# Patient Record
Sex: Male | Born: 1970
Health system: Southern US, Community
[De-identification: ages and names within clinical notes are randomized; demographics above are authoritative.]

## PROBLEM LIST (undated history)

## (undated) DIAGNOSIS — R000 Tachycardia, unspecified: Secondary | ICD-10-CM

## (undated) DIAGNOSIS — I10 Essential (primary) hypertension: Secondary | ICD-10-CM

## (undated) HISTORY — PX: CHOLECYSTECTOMY: SHX55

## (undated) HISTORY — PX: ANKLE SURGERY: SHX546

---

## 1998-10-11 ENCOUNTER — Ambulatory Visit (HOSPITAL_BASED_OUTPATIENT_CLINIC_OR_DEPARTMENT_OTHER): Admission: RE | Admit: 1998-10-11 | Discharge: 1998-10-11 | Payer: Self-pay | Admitting: Orthopedic Surgery

## 2004-06-30 ENCOUNTER — Encounter (INDEPENDENT_AMBULATORY_CARE_PROVIDER_SITE_OTHER): Payer: Self-pay | Admitting: Specialist

## 2004-06-30 ENCOUNTER — Ambulatory Visit (HOSPITAL_COMMUNITY): Admission: RE | Admit: 2004-06-30 | Discharge: 2004-06-30 | Payer: Self-pay | Admitting: *Deleted

## 2005-02-23 ENCOUNTER — Emergency Department: Payer: Self-pay | Admitting: Emergency Medicine

## 2008-08-27 ENCOUNTER — Ambulatory Visit: Payer: Self-pay | Admitting: Family Medicine

## 2009-06-14 ENCOUNTER — Ambulatory Visit: Payer: Self-pay

## 2009-06-26 ENCOUNTER — Encounter: Admission: RE | Admit: 2009-06-26 | Discharge: 2009-06-26 | Payer: Self-pay | Admitting: *Deleted

## 2009-07-18 ENCOUNTER — Ambulatory Visit: Payer: Self-pay | Admitting: Anesthesiology

## 2009-12-06 ENCOUNTER — Ambulatory Visit: Payer: Self-pay

## 2011-03-06 ENCOUNTER — Ambulatory Visit: Payer: Self-pay

## 2011-05-22 ENCOUNTER — Ambulatory Visit: Payer: Self-pay | Admitting: Anesthesiology

## 2011-05-27 ENCOUNTER — Ambulatory Visit: Payer: Self-pay | Admitting: Anesthesiology

## 2011-09-08 ENCOUNTER — Ambulatory Visit: Payer: Self-pay | Admitting: Family Medicine

## 2011-10-15 ENCOUNTER — Emergency Department: Payer: Self-pay | Admitting: Emergency Medicine

## 2011-10-18 ENCOUNTER — Emergency Department: Payer: Self-pay | Admitting: Emergency Medicine

## 2011-10-20 ENCOUNTER — Other Ambulatory Visit: Payer: Self-pay | Admitting: Orthopaedic Surgery

## 2011-10-20 DIAGNOSIS — M549 Dorsalgia, unspecified: Secondary | ICD-10-CM

## 2011-10-27 ENCOUNTER — Other Ambulatory Visit: Payer: Self-pay

## 2011-11-01 ENCOUNTER — Inpatient Hospital Stay: Admission: RE | Admit: 2011-11-01 | Payer: Self-pay | Source: Ambulatory Visit

## 2011-11-02 ENCOUNTER — Emergency Department: Payer: Self-pay | Admitting: Emergency Medicine

## 2011-11-03 ENCOUNTER — Ambulatory Visit
Admission: RE | Admit: 2011-11-03 | Discharge: 2011-11-03 | Disposition: A | Discharge status: Home / Self Care | Payer: BC Managed Care – PPO | Source: Ambulatory Visit | Attending: Orthopaedic Surgery | Admitting: Orthopaedic Surgery

## 2011-11-03 DIAGNOSIS — M549 Dorsalgia, unspecified: Secondary | ICD-10-CM

## 2011-11-03 MED ORDER — GADOBENATE DIMEGLUMINE 529 MG/ML IV SOLN
19.0000 mL | Freq: Once | INTRAVENOUS | Status: AC | PRN
  Administered 2011-11-03: 19 mL via INTRAVENOUS

## 2012-03-09 ENCOUNTER — Ambulatory Visit: Payer: Self-pay | Admitting: Internal Medicine

## 2012-03-09 LAB — URINALYSIS, COMPLETE
Bilirubin,UR: NEGATIVE
Glucose,UR: NEGATIVE mg/dL (ref 0–75)
Ketone: NEGATIVE

## 2012-06-16 ENCOUNTER — Emergency Department: Payer: Self-pay | Admitting: Emergency Medicine

## 2012-08-02 ENCOUNTER — Emergency Department: Payer: Self-pay | Admitting: Emergency Medicine

## 2012-08-02 LAB — CBC
HGB: 14.8 g/dL (ref 13.0–18.0)
MCHC: 33.9 g/dL (ref 32.0–36.0)
MCV: 88 fL (ref 80–100)
Platelet: 202 10*3/uL (ref 150–440)
RBC: 4.97 10*6/uL (ref 4.40–5.90)
RDW: 13.4 % (ref 11.5–14.5)
WBC: 7.5 10*3/uL (ref 3.8–10.6)

## 2012-08-02 LAB — URINALYSIS, COMPLETE
Bacteria: NONE SEEN
Bilirubin,UR: NEGATIVE
Glucose,UR: NEGATIVE mg/dL (ref 0–75)
Ketone: NEGATIVE
Nitrite: NEGATIVE
Protein: NEGATIVE
RBC,UR: 2 /HPF (ref 0–5)
Specific Gravity: 1.027 (ref 1.003–1.030)

## 2012-08-02 LAB — COMPREHENSIVE METABOLIC PANEL
Alkaline Phosphatase: 77 U/L (ref 50–136)
Anion Gap: 5 — ABNORMAL LOW (ref 7–16)
BUN: 9 mg/dL (ref 7–18)
Co2: 29 mmol/L (ref 21–32)
Creatinine: 1.12 mg/dL (ref 0.60–1.30)
EGFR (Non-African Amer.): >60
Potassium: 3.5 mmol/L (ref 3.5–5.1)
Sodium: 138 mmol/L (ref 136–145)

## 2012-08-02 LAB — LIPASE, BLOOD: Lipase: 70 U/L — ABNORMAL LOW (ref 73–393)

## 2012-09-07 ENCOUNTER — Other Ambulatory Visit: Payer: Self-pay | Admitting: Family Medicine

## 2012-09-07 DIAGNOSIS — M79671 Pain in right foot: Secondary | ICD-10-CM

## 2012-09-12 ENCOUNTER — Other Ambulatory Visit: Payer: BC Managed Care – PPO

## 2012-09-14 ENCOUNTER — Ambulatory Visit
Admission: RE | Admit: 2012-09-14 | Discharge: 2012-09-14 | Disposition: A | Discharge status: Home / Self Care | Payer: BC Managed Care – PPO | Source: Ambulatory Visit | Attending: Family Medicine | Admitting: Family Medicine

## 2012-09-14 DIAGNOSIS — M79671 Pain in right foot: Secondary | ICD-10-CM

## 2012-11-07 ENCOUNTER — Ambulatory Visit: Payer: Self-pay | Admitting: Emergency Medicine

## 2013-01-19 ENCOUNTER — Emergency Department: Payer: Self-pay | Admitting: Emergency Medicine

## 2013-01-19 LAB — COMPREHENSIVE METABOLIC PANEL
Calcium, Total: 9.2 mg/dL (ref 8.5–10.1)
Co2: 29 mmol/L (ref 21–32)
Creatinine: 1.14 mg/dL (ref 0.60–1.30)
SGOT(AST): 33 U/L (ref 15–37)
SGPT (ALT): 45 U/L (ref 12–78)
Sodium: 139 mmol/L (ref 136–145)

## 2013-01-19 LAB — CBC
HCT: 44.6 % (ref 40.0–52.0)
HGB: 15.6 g/dL (ref 13.0–18.0)
MCHC: 35 g/dL (ref 32.0–36.0)
RBC: 5.23 10*6/uL (ref 4.40–5.90)
RDW: 13.7 % (ref 11.5–14.5)

## 2013-01-30 ENCOUNTER — Other Ambulatory Visit: Payer: Self-pay | Admitting: Orthopedic Surgery

## 2013-01-30 DIAGNOSIS — M545 Low back pain, unspecified: Secondary | ICD-10-CM

## 2013-01-30 DIAGNOSIS — M5410 Radiculopathy, site unspecified: Secondary | ICD-10-CM

## 2013-01-30 DIAGNOSIS — M549 Dorsalgia, unspecified: Secondary | ICD-10-CM

## 2014-03-28 ENCOUNTER — Ambulatory Visit: Payer: Self-pay | Admitting: Physician Assistant

## 2014-04-14 ENCOUNTER — Ambulatory Visit: Payer: Self-pay | Admitting: Internal Medicine

## 2014-06-24 ENCOUNTER — Ambulatory Visit: Payer: Self-pay | Admitting: Physician Assistant

## 2014-07-20 ENCOUNTER — Emergency Department
Admit: 2014-07-20 | Disposition: A | Discharge status: Home / Self Care | Payer: Self-pay | Admitting: Emergency Medicine

## 2014-09-27 ENCOUNTER — Other Ambulatory Visit: Payer: Self-pay | Admitting: Family Medicine

## 2015-03-29 ENCOUNTER — Other Ambulatory Visit: Payer: Self-pay | Admitting: Orthopedic Surgery

## 2015-03-29 DIAGNOSIS — G8929 Other chronic pain: Secondary | ICD-10-CM

## 2015-03-29 DIAGNOSIS — M5136 Other intervertebral disc degeneration, lumbar region: Secondary | ICD-10-CM

## 2015-03-29 DIAGNOSIS — M5441 Lumbago with sciatica, right side: Principal | ICD-10-CM

## 2015-04-06 ENCOUNTER — Other Ambulatory Visit: Payer: No Typology Code available for payment source

## 2015-04-09 ENCOUNTER — Ambulatory Visit
Admission: RE | Admit: 2015-04-09 | Discharge: 2015-04-09 | Disposition: A | Discharge status: Home / Self Care | Payer: BLUE CROSS/BLUE SHIELD | Source: Ambulatory Visit | Attending: Orthopedic Surgery | Admitting: Orthopedic Surgery

## 2015-04-09 DIAGNOSIS — G8929 Other chronic pain: Secondary | ICD-10-CM

## 2015-04-09 DIAGNOSIS — M5136 Other intervertebral disc degeneration, lumbar region: Secondary | ICD-10-CM

## 2015-04-09 DIAGNOSIS — M5441 Lumbago with sciatica, right side: Principal | ICD-10-CM

## 2015-04-10 ENCOUNTER — Other Ambulatory Visit: Payer: No Typology Code available for payment source

## 2015-04-19 ENCOUNTER — Ambulatory Visit: Payer: No Typology Code available for payment source

## 2015-08-21 ENCOUNTER — Other Ambulatory Visit: Payer: Self-pay | Admitting: Unknown Physician Specialty

## 2015-08-21 DIAGNOSIS — M5416 Radiculopathy, lumbar region: Secondary | ICD-10-CM

## 2015-09-13 ENCOUNTER — Ambulatory Visit
Admission: RE | Admit: 2015-09-13 | Discharge: 2015-09-13 | Disposition: A | Discharge status: Home / Self Care | Payer: BLUE CROSS/BLUE SHIELD | Source: Ambulatory Visit | Attending: Unknown Physician Specialty | Admitting: Unknown Physician Specialty

## 2015-09-13 DIAGNOSIS — M5416 Radiculopathy, lumbar region: Secondary | ICD-10-CM

## 2015-09-13 MED ORDER — METHYLPREDNISOLONE ACETATE 40 MG/ML INJ SUSP (RADIOLOG
120.0000 mg | Freq: Once | INTRAMUSCULAR | Status: AC
  Administered 2015-09-13: 120 mg via EPIDURAL

## 2015-09-13 MED ORDER — IOPAMIDOL (ISOVUE-M 200) INJECTION 41%
1.0000 mL | Freq: Once | INTRAMUSCULAR | Status: AC
  Administered 2015-09-13: 1 mL via EPIDURAL

## 2015-09-13 NOTE — Discharge Instructions (Signed)

## 2016-02-24 ENCOUNTER — Telehealth (INDEPENDENT_AMBULATORY_CARE_PROVIDER_SITE_OTHER): Payer: Self-pay | Admitting: *Deleted

## 2016-02-24 NOTE — Telephone Encounter (Signed)
ERROR

## 2016-11-19 ENCOUNTER — Other Ambulatory Visit: Payer: Self-pay | Admitting: Podiatry

## 2016-11-19 DIAGNOSIS — G5761 Lesion of plantar nerve, right lower limb: Secondary | ICD-10-CM

## 2016-11-25 ENCOUNTER — Ambulatory Visit: Payer: BLUE CROSS/BLUE SHIELD

## 2016-12-01 ENCOUNTER — Other Ambulatory Visit: Payer: Self-pay | Admitting: Podiatry

## 2016-12-01 DIAGNOSIS — G5761 Lesion of plantar nerve, right lower limb: Secondary | ICD-10-CM

## 2016-12-05 ENCOUNTER — Ambulatory Visit
Admission: RE | Admit: 2016-12-05 | Discharge: 2016-12-05 | Disposition: A | Discharge status: Home / Self Care | Payer: Managed Care, Other (non HMO) | Source: Ambulatory Visit | Attending: Podiatry | Admitting: Podiatry

## 2016-12-05 DIAGNOSIS — M19071 Primary osteoarthritis, right ankle and foot: Secondary | ICD-10-CM | POA: Insufficient documentation

## 2016-12-05 DIAGNOSIS — M25871 Other specified joint disorders, right ankle and foot: Secondary | ICD-10-CM | POA: Diagnosis not present

## 2016-12-05 DIAGNOSIS — G5761 Lesion of plantar nerve, right lower limb: Secondary | ICD-10-CM | POA: Diagnosis not present

## 2016-12-05 LAB — POCT I-STAT CREATININE: Creatinine, Ser: 1.3 mg/dL — ABNORMAL HIGH (ref 0.61–1.24)

## 2016-12-05 MED ORDER — GADOBENATE DIMEGLUMINE 529 MG/ML IV SOLN
19.0000 mL | Freq: Once | INTRAVENOUS | Status: AC | PRN
  Administered 2016-12-05: 19 mL via INTRAVENOUS

## 2017-10-15 ENCOUNTER — Other Ambulatory Visit: Payer: Self-pay | Admitting: Anesthesiology

## 2017-10-15 ENCOUNTER — Ambulatory Visit
Admission: RE | Admit: 2017-10-15 | Discharge: 2017-10-15 | Disposition: A | Discharge status: Home / Self Care | Payer: Managed Care, Other (non HMO) | Source: Ambulatory Visit | Attending: Anesthesiology | Admitting: Anesthesiology

## 2017-10-15 DIAGNOSIS — M25511 Pain in right shoulder: Secondary | ICD-10-CM | POA: Diagnosis not present

## 2017-11-01 ENCOUNTER — Other Ambulatory Visit: Payer: Self-pay | Admitting: Orthopedic Surgery

## 2017-11-01 DIAGNOSIS — M25511 Pain in right shoulder: Secondary | ICD-10-CM

## 2017-11-19 ENCOUNTER — Other Ambulatory Visit: Payer: Managed Care, Other (non HMO)

## 2017-12-17 ENCOUNTER — Other Ambulatory Visit: Payer: Managed Care, Other (non HMO)

## 2017-12-22 ENCOUNTER — Other Ambulatory Visit: Payer: Managed Care, Other (non HMO)

## 2018-04-27 DIAGNOSIS — G8929 Other chronic pain: Secondary | ICD-10-CM | POA: Diagnosis not present

## 2018-04-27 DIAGNOSIS — M79671 Pain in right foot: Secondary | ICD-10-CM | POA: Diagnosis not present

## 2018-04-27 DIAGNOSIS — F1120 Opioid dependence, uncomplicated: Secondary | ICD-10-CM | POA: Diagnosis not present

## 2018-04-27 DIAGNOSIS — Z79899 Other long term (current) drug therapy: Secondary | ICD-10-CM | POA: Diagnosis not present

## 2018-04-27 DIAGNOSIS — M545 Low back pain: Secondary | ICD-10-CM | POA: Diagnosis not present

## 2018-05-18 DIAGNOSIS — Z1331 Encounter for screening for depression: Secondary | ICD-10-CM | POA: Diagnosis not present

## 2018-05-18 DIAGNOSIS — I10 Essential (primary) hypertension: Secondary | ICD-10-CM | POA: Diagnosis not present

## 2018-05-18 DIAGNOSIS — H538 Other visual disturbances: Secondary | ICD-10-CM | POA: Diagnosis not present

## 2018-05-18 DIAGNOSIS — R739 Hyperglycemia, unspecified: Secondary | ICD-10-CM | POA: Diagnosis not present

## 2018-05-18 DIAGNOSIS — R9431 Abnormal electrocardiogram [ECG] [EKG]: Secondary | ICD-10-CM | POA: Diagnosis not present

## 2018-06-01 DIAGNOSIS — M545 Low back pain: Secondary | ICD-10-CM | POA: Diagnosis not present

## 2018-06-01 DIAGNOSIS — F1120 Opioid dependence, uncomplicated: Secondary | ICD-10-CM | POA: Diagnosis not present

## 2018-06-01 DIAGNOSIS — M79671 Pain in right foot: Secondary | ICD-10-CM | POA: Diagnosis not present

## 2018-06-01 DIAGNOSIS — G8929 Other chronic pain: Secondary | ICD-10-CM | POA: Diagnosis not present

## 2018-06-01 DIAGNOSIS — Z79899 Other long term (current) drug therapy: Secondary | ICD-10-CM | POA: Diagnosis not present

## 2018-06-08 DIAGNOSIS — M545 Low back pain: Secondary | ICD-10-CM | POA: Diagnosis not present

## 2018-06-08 DIAGNOSIS — G894 Chronic pain syndrome: Secondary | ICD-10-CM | POA: Diagnosis not present

## 2018-06-08 DIAGNOSIS — Z9119 Patient's noncompliance with other medical treatment and regimen: Secondary | ICD-10-CM | POA: Diagnosis not present

## 2018-06-08 DIAGNOSIS — M79671 Pain in right foot: Secondary | ICD-10-CM | POA: Diagnosis not present

## 2018-07-06 DIAGNOSIS — G894 Chronic pain syndrome: Secondary | ICD-10-CM | POA: Diagnosis not present

## 2018-07-06 DIAGNOSIS — M545 Low back pain: Secondary | ICD-10-CM | POA: Diagnosis not present

## 2018-07-06 DIAGNOSIS — Z9119 Patient's noncompliance with other medical treatment and regimen: Secondary | ICD-10-CM | POA: Diagnosis not present

## 2018-07-06 DIAGNOSIS — M79671 Pain in right foot: Secondary | ICD-10-CM | POA: Diagnosis not present

## 2018-08-03 DIAGNOSIS — G894 Chronic pain syndrome: Secondary | ICD-10-CM | POA: Diagnosis not present

## 2018-08-03 DIAGNOSIS — Z9119 Patient's noncompliance with other medical treatment and regimen: Secondary | ICD-10-CM | POA: Diagnosis not present

## 2018-08-03 DIAGNOSIS — M79671 Pain in right foot: Secondary | ICD-10-CM | POA: Diagnosis not present

## 2018-08-03 DIAGNOSIS — M545 Low back pain: Secondary | ICD-10-CM | POA: Diagnosis not present
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
## 2018-08-24 DIAGNOSIS — G894 Chronic pain syndrome: Secondary | ICD-10-CM | POA: Diagnosis not present

## 2018-08-24 DIAGNOSIS — M545 Low back pain: Secondary | ICD-10-CM | POA: Diagnosis not present

## 2018-08-24 DIAGNOSIS — M79671 Pain in right foot: Secondary | ICD-10-CM | POA: Diagnosis not present

## 2018-08-24 DIAGNOSIS — Z9119 Patient's noncompliance with other medical treatment and regimen: Secondary | ICD-10-CM | POA: Diagnosis not present

## 2018-10-05 DIAGNOSIS — Z9119 Patient's noncompliance with other medical treatment and regimen: Secondary | ICD-10-CM | POA: Diagnosis not present

## 2018-10-05 DIAGNOSIS — G894 Chronic pain syndrome: Secondary | ICD-10-CM | POA: Diagnosis not present

## 2018-10-05 DIAGNOSIS — Z79899 Other long term (current) drug therapy: Secondary | ICD-10-CM | POA: Diagnosis not present

## 2018-10-05 DIAGNOSIS — M79671 Pain in right foot: Secondary | ICD-10-CM | POA: Diagnosis not present

## 2018-10-05 DIAGNOSIS — M545 Low back pain: Secondary | ICD-10-CM | POA: Diagnosis not present

## 2018-10-05 DIAGNOSIS — F1120 Opioid dependence, uncomplicated: Secondary | ICD-10-CM | POA: Diagnosis not present

## 2018-10-27 DIAGNOSIS — M79671 Pain in right foot: Secondary | ICD-10-CM | POA: Diagnosis not present

## 2018-10-27 DIAGNOSIS — M545 Low back pain: Secondary | ICD-10-CM | POA: Diagnosis not present

## 2018-10-27 DIAGNOSIS — G894 Chronic pain syndrome: Secondary | ICD-10-CM | POA: Diagnosis not present

## 2018-10-27 DIAGNOSIS — Z9119 Patient's noncompliance with other medical treatment and regimen: Secondary | ICD-10-CM | POA: Diagnosis not present

## 2018-11-02 DIAGNOSIS — M545 Low back pain: Secondary | ICD-10-CM | POA: Diagnosis not present

## 2018-11-02 DIAGNOSIS — M79671 Pain in right foot: Secondary | ICD-10-CM | POA: Diagnosis not present

## 2018-11-02 DIAGNOSIS — Z79899 Other long term (current) drug therapy: Secondary | ICD-10-CM | POA: Diagnosis not present

## 2018-11-02 DIAGNOSIS — Z9119 Patient's noncompliance with other medical treatment and regimen: Secondary | ICD-10-CM | POA: Diagnosis not present

## 2018-11-02 DIAGNOSIS — G894 Chronic pain syndrome: Secondary | ICD-10-CM | POA: Diagnosis not present

## 2018-11-02 DIAGNOSIS — F1120 Opioid dependence, uncomplicated: Secondary | ICD-10-CM | POA: Diagnosis not present

## 2018-11-07 DIAGNOSIS — J069 Acute upper respiratory infection, unspecified: Secondary | ICD-10-CM | POA: Diagnosis not present

## 2018-11-07 DIAGNOSIS — Z03818 Encounter for observation for suspected exposure to other biological agents ruled out: Secondary | ICD-10-CM | POA: Diagnosis not present

## 2018-11-07 DIAGNOSIS — R5383 Other fatigue: Secondary | ICD-10-CM | POA: Diagnosis not present

## 2018-11-07 DIAGNOSIS — Z1231 Encounter for screening mammogram for malignant neoplasm of breast: Secondary | ICD-10-CM | POA: Diagnosis not present

## 2018-11-30 DIAGNOSIS — G894 Chronic pain syndrome: Secondary | ICD-10-CM | POA: Diagnosis not present

## 2018-11-30 DIAGNOSIS — Z9119 Patient's noncompliance with other medical treatment and regimen: Secondary | ICD-10-CM | POA: Diagnosis not present

## 2018-11-30 DIAGNOSIS — M79671 Pain in right foot: Secondary | ICD-10-CM | POA: Diagnosis not present

## 2018-11-30 DIAGNOSIS — M545 Low back pain: Secondary | ICD-10-CM | POA: Diagnosis not present

## 2019-02-06 ENCOUNTER — Encounter: Payer: Self-pay | Admitting: Emergency Medicine

## 2019-02-06 ENCOUNTER — Emergency Department: Payer: BC Managed Care – PPO

## 2019-02-06 ENCOUNTER — Emergency Department
Admission: EM | Admit: 2019-02-06 | Discharge: 2019-02-06 | Disposition: A | Discharge status: Home / Self Care | Payer: BC Managed Care – PPO | Attending: Emergency Medicine | Admitting: Emergency Medicine

## 2019-02-06 ENCOUNTER — Other Ambulatory Visit: Payer: Self-pay

## 2019-02-06 DIAGNOSIS — R109 Unspecified abdominal pain: Secondary | ICD-10-CM

## 2019-02-06 DIAGNOSIS — I10 Essential (primary) hypertension: Secondary | ICD-10-CM | POA: Diagnosis not present

## 2019-02-06 DIAGNOSIS — R1011 Right upper quadrant pain: Secondary | ICD-10-CM | POA: Diagnosis present

## 2019-02-06 DIAGNOSIS — F17290 Nicotine dependence, other tobacco product, uncomplicated: Secondary | ICD-10-CM | POA: Insufficient documentation

## 2019-02-06 HISTORY — DX: Essential (primary) hypertension: I10

## 2019-02-06 LAB — COMPREHENSIVE METABOLIC PANEL
ALT: 66 U/L — ABNORMAL HIGH (ref 0–44)
AST: 40 U/L (ref 15–41)
Albumin: 4.9 g/dL (ref 3.5–5.0)
Alkaline Phosphatase: 88 U/L (ref 38–126)
Anion gap: 13 (ref 5–15)
BUN: 15 mg/dL (ref 6–20)
CO2: 28 mmol/L (ref 22–32)
Calcium: 9.7 mg/dL (ref 8.9–10.3)
Chloride: 96 mmol/L — ABNORMAL LOW (ref 98–111)
Creatinine, Ser: 1.05 mg/dL (ref 0.61–1.24)
GFR calc Af Amer: >60 mL/min (ref >60)
GFR calc non Af Amer: >60 mL/min (ref >60)
Glucose, Bld: 108 mg/dL — ABNORMAL HIGH (ref 70–99)
Potassium: 3.6 mmol/L (ref 3.5–5.1)
Sodium: 137 mmol/L (ref 135–145)
Total Bilirubin: 1.5 mg/dL — ABNORMAL HIGH (ref 0.3–1.2)
Total Protein: 8.1 g/dL (ref 6.5–8.1)

## 2019-02-06 LAB — CBC
HCT: 45.8 % (ref 39.0–52.0)
Hemoglobin: 15.6 g/dL (ref 13.0–17.0)
MCH: 28.8 pg (ref 26.0–34.0)
MCHC: 34.1 g/dL (ref 30.0–36.0)
MCV: 84.5 fL (ref 80.0–100.0)
Platelets: 248 10*3/uL (ref 150–400)
RBC: 5.42 MIL/uL (ref 4.22–5.81)
RDW: 13 % (ref 11.5–15.5)
WBC: 8.9 10*3/uL (ref 4.0–10.5)
nRBC: 0 % (ref 0.0–0.2)

## 2019-02-06 LAB — URINALYSIS, COMPLETE (UACMP) WITH MICROSCOPIC
Bacteria, UA: NONE SEEN
Bilirubin Urine: NEGATIVE
Glucose, UA: NEGATIVE mg/dL
Hgb urine dipstick: NEGATIVE
Ketones, ur: NEGATIVE mg/dL
Leukocytes,Ua: NEGATIVE
Nitrite: NEGATIVE
Protein, ur: NEGATIVE mg/dL
Specific Gravity, Urine: 1.015 (ref 1.005–1.030)
Squamous Epithelial / LPF: NONE SEEN (ref 0–5)
WBC, UA: NONE SEEN WBC/hpf (ref 0–5)
pH: 6 (ref 5.0–8.0)

## 2019-02-06 LAB — LIPASE, BLOOD: Lipase: 17 U/L (ref 11–51)

## 2019-02-06 MED ORDER — SODIUM CHLORIDE 0.9% FLUSH: 3.0000 mL | Freq: Once | INTRAVENOUS | Status: DC

## 2019-02-06 MED ORDER — FAMOTIDINE 20 MG PO TABS: 20.0000 mg | ORAL_TABLET | Freq: Every day | ORAL | 1 refills | Status: DC

## 2019-02-06 MED ORDER — DICYCLOMINE HCL 20 MG PO TABS: 20.0000 mg | ORAL_TABLET | Freq: Three times a day (TID) | ORAL | 0 refills | Status: DC | PRN

## 2019-02-06 MED ORDER — SUCRALFATE 1 G PO TABS: 1.0000 g | ORAL_TABLET | Freq: Four times a day (QID) | ORAL | 0 refills | Status: DC

## 2019-02-06 NOTE — ED Provider Notes (Signed)
[facility] Emergency Department Provider Note   ____________________________________________   I have reviewed the triage vital signs and the nursing notes.   HISTORY  Chief Complaint Abdominal Pain   History limited by: Not Limited   HPI Justin Flowers is a 48 y.o. male who presents to the emergency department today because of concerns for abdominal pain.  Patient states that the pain is located in his right abdomen.  Has been present for the past few days.  Initially the pain was intermittent.  Today however when the patient was standing up the pain became more severe.  It did start radiating into his right groin.  It was accompanied by some nausea.  Patient denies any true vomiting.  Denies any fevers.  Denies similar symptoms in the past.  Has not noticed any change in urination.  Records reviewed. Per medical record review patient has a history of HTN.  Past Medical History:  Diagnosis Date  . Hypertension     There are no active problems to display for this patient.   Past Surgical History:  Procedure Laterality Date  . ANKLE SURGERY      Prior to Admission medications   Medication Sig Start Date End Date Taking? Authorizing Provider  gabapentin (NEURONTIN) 300 MG capsule TAKE ONE CAPSULE BY MOUTH TWICE A DAY 09/27/14   Guadalupe Maple, MD    Allergies Patient has no known allergies.  History reviewed. No pertinent family history.  Social History Social History   Tobacco Use  . Smoking status: Former Smoker    Packs/day: 1.00    Years: 6.00    Pack years: 6.00    Types: Cigarettes    Quit date: 09/13/1995    Years since quitting: 23.4  . Smokeless tobacco: Current User  Substance Use Topics  . Alcohol use: Not Currently    Alcohol/week: 0.0 standard drinks  . Drug use: Never    Review of Systems Constitutional: No fever/chills Eyes: No visual changes. ENT: No sore throat. Cardiovascular: Denies chest  pain. Respiratory: Denies shortness of breath. Gastrointestinal: Positive for right sided abdominal pain. Genitourinary: Negative for dysuria. Musculoskeletal: Negative for back pain. Skin: Negative for rash. Neurological: Negative for headaches, focal weakness or numbness.  ____________________________________________   PHYSICAL EXAM:  VITAL SIGNS: ED Triage Vitals  Enc Vitals Group     BP 02/06/19 1326 (!) 158/90     Pulse Rate 02/06/19 1326 92     Resp 02/06/19 1326 20     Temp 02/06/19 1326 98 F (36.7 C)     Temp Source 02/06/19 1326 Oral     SpO2 02/06/19 1326 97 %     Weight 02/06/19 1330 215 lb (97.5 kg)     Height 02/06/19 1330 6' (1.829 m)     Head Circumference --      Peak Flow --      Pain Score 02/06/19 1327 4   Constitutional: Alert and oriented.  Eyes: Conjunctivae are normal.  ENT      Head: Normocephalic and atraumatic.      Nose: No congestion/rhinnorhea.      Mouth/Throat: Mucous membranes are moist.      Neck: No stridor. Hematological/Lymphatic/Immunilogical: No cervical lymphadenopathy. Cardiovascular: Normal rate, regular rhythm.  No murmurs, rubs, or gallops.  Respiratory: Normal respiratory effort without tachypnea nor retractions. Breath sounds are clear and equal bilaterally. No wheezes/rales/rhonchi. Gastrointestinal: Soft and tender to palpation along the right side of the abdomen.  Genitourinary: Deferred Musculoskeletal: Normal  range of motion in all extremities. No lower extremity edema. Neurologic:  Normal speech and language. No gross focal neurologic deficits are appreciated.  Skin:  Skin is warm, dry and intact. No rash noted. Psychiatric: Mood and affect are normal. Speech and behavior are normal. Patient exhibits appropriate insight and judgment.  ____________________________________________    LABS (pertinent positives/negatives)  UA clear, unremarkable CMP wnl except cl 96, glu 108, alt 66, t bili 1.5 Lipase 17 CBC wbc  8.9, hgb 15.6, plt 248 ____________________________________________   EKG  I, Phineas Semen, attending physician, personally viewed and interpreted this EKG  EKG Time: 1327 Rate: 103 Rhythm: sinus tachycardia Axis: normal Intervals: qtc 466 QRS: no st elevation, q waves III ST changes: no st elevation Impression: abnormal ekg   ____________________________________________    RADIOLOGY  CT renal No kidney stone. Normal appendix  US ruq Normal gallbladder  ____________________________________________   PROCEDURES  Procedures  ____________________________________________   INITIAL IMPRESSION / ASSESSMENT AND PLAN / ED COURSE  Pertinent labs & imaging results that were available during my care of the patient were reviewed by me and considered in my medical decision making (see chart for details).   Patient presented to the emergency department today because of concerns for intermittent right upper quadrant pain that did have some radiation to his right groin.  Was accompanied by some nausea today. On exam patient does have some tenderness to the right side of the abdomen. Given radiation to the groin did have concern for kidney stone so CT scan was ordered. This however did not show etiology of the patient's pain. [place] was then ordered to evaluate for gallstones. This was also negative. At this time unclear etiology of the patient's pain. Will give prescriptions in case of duodenitis/gastritis. Discussed return precautions and follow up with GI.  ____________________________________________   FINAL CLINICAL IMPRESSION(S) / ED DIAGNOSES  Final diagnoses:  RUQ pain  Abdominal pain, unspecified abdominal location     Note: This dictation was prepared with Dragon dictation. Any transcriptional errors that result from this process are unintentional     Phineas Semen, MD 02/06/19 1859

## 2019-02-06 NOTE — ED Triage Notes (Signed)
Right upper quadrant pain.

## 2019-02-06 NOTE — Discharge Instructions (Addendum)
Please seek medical attention for any high fevers, chest pain, shortness of breath, change in behavior, persistent vomiting, bloody stool or any other new or concerning symptoms.  

## 2019-02-06 NOTE — ED Triage Notes (Signed)
Pt presents to ED via POV with c/o RUQ abdominal pain that has radiated to RLQ and R groin. Pt states pain started several days ago and has been intermittent with today being the worst day. Pt states en route he was sweating and felt "water coming up in [his] mouth".  Upon arrival to ED Pt is alert and oriented, ambulatory without difficulty at this time. Pt states pain was "stinging and burning" en route. Pt denies any hx of abdominal surgeries.

## 2019-04-18 ENCOUNTER — Ambulatory Visit: Payer: BC Managed Care – PPO | Admitting: Gastroenterology

## 2020-01-27 ENCOUNTER — Other Ambulatory Visit: Payer: Self-pay | Admitting: Internal Medicine

## 2020-08-22 ENCOUNTER — Other Ambulatory Visit: Payer: Self-pay | Admitting: Anesthesiology

## 2020-08-22 DIAGNOSIS — M545 Low back pain, unspecified: Secondary | ICD-10-CM

## 2020-11-10 ENCOUNTER — Emergency Department
Admission: EM | Admit: 2020-11-10 | Discharge: 2020-11-11 | Disposition: A | Discharge status: Home / Self Care | Payer: 59 | Attending: Emergency Medicine | Admitting: Emergency Medicine

## 2020-11-10 ENCOUNTER — Emergency Department: Payer: 59

## 2020-11-10 ENCOUNTER — Encounter: Payer: Self-pay | Admitting: *Deleted

## 2020-11-10 ENCOUNTER — Other Ambulatory Visit: Payer: Self-pay

## 2020-11-10 DIAGNOSIS — K30 Functional dyspepsia: Secondary | ICD-10-CM

## 2020-11-10 DIAGNOSIS — D72829 Elevated white blood cell count, unspecified: Secondary | ICD-10-CM | POA: Insufficient documentation

## 2020-11-10 DIAGNOSIS — R1010 Upper abdominal pain, unspecified: Secondary | ICD-10-CM | POA: Insufficient documentation

## 2020-11-10 DIAGNOSIS — F17200 Nicotine dependence, unspecified, uncomplicated: Secondary | ICD-10-CM | POA: Insufficient documentation

## 2020-11-10 DIAGNOSIS — R1013 Epigastric pain: Secondary | ICD-10-CM | POA: Diagnosis not present

## 2020-11-10 DIAGNOSIS — Z79899 Other long term (current) drug therapy: Secondary | ICD-10-CM | POA: Insufficient documentation

## 2020-11-10 DIAGNOSIS — I10 Essential (primary) hypertension: Secondary | ICD-10-CM | POA: Insufficient documentation

## 2020-11-10 LAB — COMPREHENSIVE METABOLIC PANEL
ALT: 49 U/L — ABNORMAL HIGH (ref 0–44)
AST: 41 U/L (ref 15–41)
Albumin: 4.8 g/dL (ref 3.5–5.0)
Alkaline Phosphatase: 57 U/L (ref 38–126)
Anion gap: 10 (ref 5–15)
BUN: 18 mg/dL (ref 6–20)
CO2: 24 mmol/L (ref 22–32)
Calcium: 9.8 mg/dL (ref 8.9–10.3)
Chloride: 98 mmol/L (ref 98–111)
Creatinine, Ser: 1.15 mg/dL (ref 0.61–1.24)
GFR, Estimated: >60 mL/min (ref >60)
Glucose, Bld: 107 mg/dL — ABNORMAL HIGH (ref 70–99)
Potassium: 3.5 mmol/L (ref 3.5–5.1)
Sodium: 132 mmol/L — ABNORMAL LOW (ref 135–145)
Total Bilirubin: 2.1 mg/dL — ABNORMAL HIGH (ref 0.3–1.2)
Total Protein: 8.4 g/dL — ABNORMAL HIGH (ref 6.5–8.1)

## 2020-11-10 LAB — CBC
HCT: 47.8 % (ref 39.0–52.0)
Hemoglobin: 16.4 g/dL (ref 13.0–17.0)
MCH: 29 pg (ref 26.0–34.0)
MCHC: 34.3 g/dL (ref 30.0–36.0)
MCV: 84.5 fL (ref 80.0–100.0)
Platelets: 294 10*3/uL (ref 150–400)
RBC: 5.66 MIL/uL (ref 4.22–5.81)
RDW: 12.9 % (ref 11.5–15.5)
WBC: 12.1 10*3/uL — ABNORMAL HIGH (ref 4.0–10.5)
nRBC: 0 % (ref 0.0–0.2)

## 2020-11-10 LAB — LIPASE, BLOOD: Lipase: 27 U/L (ref 11–51)

## 2020-11-10 MED ORDER — PANTOPRAZOLE SODIUM 40 MG IV SOLR
40.0000 mg | Freq: Once | INTRAVENOUS | Status: AC
  Administered 2020-11-10: 40 mg via INTRAVENOUS
  Filled 2020-11-10: qty 40

## 2020-11-10 MED ORDER — IOHEXOL 350 MG/ML SOLN
100.0000 mL | Freq: Once | INTRAVENOUS | Status: AC | PRN
  Administered 2020-11-10: 100 mL via INTRAVENOUS

## 2020-11-10 MED ORDER — ALUM & MAG HYDROXIDE-SIMETH 200-200-20 MG/5ML PO SUSP
30.0000 mL | Freq: Once | ORAL | Status: AC
  Administered 2020-11-10: 30 mL via ORAL
  Filled 2020-11-10: qty 30

## 2020-11-10 MED ORDER — LIDOCAINE VISCOUS HCL 2 % MT SOLN
15.0000 mL | Freq: Once | OROMUCOSAL | Status: AC
  Administered 2020-11-10: 15 mL via ORAL
  Filled 2020-11-10: qty 15

## 2020-11-10 NOTE — ED Provider Notes (Signed)
[facility] Emergency Department Provider Note   ____________________________________________    I have reviewed the triage vital signs and the nursing notes.   HISTORY  Chief Complaint Abdominal Pain     HPI Justin Flowers is a 50 y.o. male with history of high blood pressure who presents with complaints of upper abdominal pain.  Patient reports pain started around noon today, he reports it has been steadily worsening.  He describes it as a burning cramping sensation.  He reports he has had this once before when he was on his way to the beach, he states that he visited an ED then and was diagnosed with possible esophagitis or esophageal spasm.  He denies diarrhea.  No vomiting.  Has not taken anything for this.  Does report that he takes buprenorphine and would prefer to avoid narcotics if possible  Past Medical History:  Diagnosis Date   Hypertension     There are no problems to display for this patient.   Past Surgical History:  Procedure Laterality Date   ANKLE SURGERY      Prior to Admission medications   Medication Sig Start Date End Date Taking? Authorizing Provider  dicyclomine (BENTYL) 20 MG tablet Take 1 tablet (20 mg total) by mouth 3 (three) times daily as needed. 02/06/19   Phineas Semen, MD  famotidine (PEPCID) 20 MG tablet Take 1 tablet (20 mg total) by mouth daily. 02/06/19 02/06/20  Phineas Semen, MD  gabapentin (NEURONTIN) 300 MG capsule TAKE ONE CAPSULE BY MOUTH TWICE A DAY 09/27/14   Steele Sizer, MD  lisinopril-hydrochlorothiazide (ZESTORETIC) 20-12.5 MG tablet TAKE 2 TABLETS BY MOUTH EVERY DAY 01/28/20   Corky Downs, MD  sucralfate (CARAFATE) 1 g tablet Take 1 tablet (1 g total) by mouth 4 (four) times daily. 02/06/19   Phineas Semen, MD     Allergies Patient has no known allergies.  No family history on file.  Social History Social History   Tobacco Use   Smoking status: Former    Packs/day:  1.00    Years: 6.00    Pack years: 6.00    Types: Cigarettes    Quit date: 09/13/1995    Years since quitting: 25.1   Smokeless tobacco: Current  Substance Use Topics   Alcohol use: Not Currently    Alcohol/week: 0.0 standard drinks   Drug use: Never    Review of Systems  Constitutional: No fever/chills Eyes: No visual changes.  ENT: No sore throat. Cardiovascular: Denies chest pain. Respiratory: Denies shortness of breath. Gastrointestinal: As above Genitourinary: Negative for dysuria. Musculoskeletal: Negative for back pain. Skin: Negative for rash. Neurological: Negative for headaches or weakness   ____________________________________________   PHYSICAL EXAM:  VITAL SIGNS: ED Triage Vitals  Enc Vitals Group     BP 11/10/20 2226 (!) 144/115     Pulse Rate 11/10/20 2226 (!) 105     Resp 11/10/20 2226 (!) 22     Temp 11/10/20 2226 98.8 F (37.1 C)     Temp src --      SpO2 11/10/20 2226 98 %     Weight --      Height --      Head Circumference --      Peak Flow --      Pain Score 11/10/20 2224 10     Pain Loc --      Pain Edu? --      Excl. in GC? --     Constitutional:  Alert and oriented.  Standing up leaning over his bed, appears uncomfortable Eyes: Conjunctivae are normal.   Nose: No congestion/rhinnorhea. Mouth/Throat: Mucous membranes are moist.    Cardiovascular: Normal rate, regular rhythm. Grossly normal heart sounds.  Good peripheral circulation. Respiratory: Normal respiratory effort.  No retractions. Lungs CTAB. Gastrointestinal: Soft, mild epigastric tenderness to palpation, no distention  Musculoskeletal: No lower extremity tenderness nor edema.  Warm and well perfused Neurologic:  Normal speech and language. No gross focal neurologic deficits are appreciated.  Skin:  Skin is warm, dry and intact. No rash noted. Psychiatric: Mood and affect are normal. Speech and behavior are normal.  ____________________________________________    LABS (all labs ordered are listed, but only abnormal results are displayed)  Labs Reviewed  CBC - Abnormal; Notable for the following components:      Result Value   WBC 12.1 (*)    All other components within normal limits  LIPASE, BLOOD  COMPREHENSIVE METABOLIC PANEL  URINALYSIS, COMPLETE (UACMP) WITH MICROSCOPIC   ____________________________________________  EKG  ED ECG REPORT I, Jene Every, the attending physician, personally viewed and interpreted this ECG.  Date: 11/10/2020  Rhythm: Sinus tachycardia QRS Axis: normal Intervals: normal ST/T Wave abnormalities: normal Narrative Interpretation: no evidence of acute ischemia  ____________________________________________  RADIOLOGY  CT abdomen pelvis ordered ____________________________________________   PROCEDURES  Procedure(s) performed: No  Procedures   Critical Care performed: No ____________________________________________   INITIAL IMPRESSION / ASSESSMENT AND PLAN / ED COURSE  Pertinent labs & imaging results that were available during my care of the patient were reviewed by me and considered in my medical decision making (see chart for details).   Patient presents with abdominal pain as described above, he appears significantly uncomfortable.  Differential includes esophagitis, gastritis, pancreatitis, colitis less likely cholelithiasis given location.  Will obtain labs, place IV.  Trial GI cocktail IV Protonix  Will obtain CT abdomen pelvis given mildly elevated white blood cell count and significant pain  I have asked my colleague to follow-up on CT results    ____________________________________________   FINAL CLINICAL IMPRESSION(S) / ED DIAGNOSES  Final diagnoses:  Epigastric pain        Note:  This document was prepared using Dragon voice recognition software and may include unintentional dictation errors.    Jene Every, MD 11/10/20 2249

## 2020-11-10 NOTE — ED Triage Notes (Signed)
Pain in the upper abdominal area radiates to the left abdomen and ribs/back, "spasms" like, started around noon, pain worse over the past hour.

## 2020-11-11 ENCOUNTER — Encounter: Payer: Self-pay | Admitting: *Deleted

## 2020-11-11 LAB — TROPONIN I (HIGH SENSITIVITY)
Troponin I (High Sensitivity): 4 ng/L (ref <18)
Troponin I (High Sensitivity): 3 ng/L (ref <18)

## 2020-11-11 MED ORDER — ALUM & MAG HYDROXIDE-SIMETH 400-400-40 MG/5ML PO SUSP: 5.0000 mL | Freq: Four times a day (QID) | ORAL | 0 refills | Status: DC | PRN

## 2020-11-11 MED ORDER — DICYCLOMINE HCL 10 MG PO CAPS: 10.0000 mg | ORAL_CAPSULE | Freq: Four times a day (QID) | ORAL | 0 refills | Status: DC

## 2020-11-11 MED ORDER — ACETAMINOPHEN 500 MG PO TABS: 1000.0000 mg | ORAL_TABLET | Freq: Once | ORAL | Status: DC

## 2020-11-11 MED ORDER — DICYCLOMINE HCL 10 MG PO CAPS
10.0000 mg | ORAL_CAPSULE | Freq: Once | ORAL | Status: AC
  Administered 2020-11-11: 10 mg via ORAL
  Filled 2020-11-11: qty 1

## 2020-11-11 NOTE — ED Notes (Signed)
Pt A&OX4 ambulatory at d/c with independent steady gait, NAD. Pt verbalized understanding of d/c instructions, prescriptions and follow up care. 

## 2020-11-11 NOTE — ED Provider Notes (Signed)
Accepted signout of this patient at 38 PM from Dr. Cyril Loosen pending blood work and imaging for epigastric abdominal pain.  CT unremarkable.  Blood work with no significant changes from previous.  Patient has been seen by GI in 2018 for similar symptoms.  Has had several previous episodes which were attributed to possible esophageal spasms.  After receiving Maalox and Bentyl patient feels markedly improved.  Will discharge home on those 2 medications.  Recommended follow-up with GI again for an endoscopy since that was not done in the past.  Discussed my standard return precautions for any worsening pain, chest pain, or fever.    I have personally reviewed the images performed during this visit and I agree with the Radiologist's read.   Interpretation by Radiologist:  CT ABDOMEN PELVIS W CONTRAST  Result Date: 11/10/2020 CLINICAL DATA:  Acute nonlocalized abdominal pain, hypertension, upper abdominal burning and cramping. EXAM: CT ABDOMEN AND PELVIS WITH CONTRAST TECHNIQUE: Multidetector CT imaging of the abdomen and pelvis was performed using the standard protocol following bolus administration of intravenous contrast. CONTRAST:  OMNIPAQUE IOHEXOL 350 MG/ML SOLN COMPARISON:  02/06/2019 FINDINGS: Lower chest: No acute abnormality. Hepatobiliary: Mild hepatic steatosis. No enhancing intrahepatic mass identified. Gallbladder unremarkable. No intra or extrahepatic biliary ductal dilation. Pancreas: Unremarkable Spleen: Unremarkable Adrenals/Urinary Tract: Adrenal glands are unremarkable. Kidneys are normal, without renal calculi, focal lesion, or hydronephrosis. Bladder is unremarkable. Stomach/Bowel: Stomach is within normal limits. Appendix appears normal. No evidence of bowel wall thickening, distention, or inflammatory changes. No free intraperitoneal gas or fluid. Vascular/Lymphatic: No significant vascular findings are present. No enlarged abdominal or pelvic lymph nodes. Reproductive: Prostate is  unremarkable. Other: No abdominal wall hernia identified.  Rectum unremarkable. Musculoskeletal: No acute or significant osseous findings. IMPRESSION: No acute intra-abdominal pathology identified. No definite radiographic explanation for the patient's reported symptoms. Mild hepatic steatosis. Electronically Signed   By: Helyn Numbers MD   On: 11/10/2020 23:38      Nita Sickle, MD 11/11/20 [phone]

## 2020-11-12 ENCOUNTER — Encounter: Payer: Self-pay | Admitting: *Deleted

## 2021-03-06 ENCOUNTER — Emergency Department
Admission: EM | Admit: 2021-03-06 | Discharge: 2021-03-06 | Disposition: A | Discharge status: Home / Self Care | Payer: 59 | Attending: Emergency Medicine | Admitting: Emergency Medicine

## 2021-03-06 ENCOUNTER — Encounter: Payer: Self-pay | Admitting: Emergency Medicine

## 2021-03-06 ENCOUNTER — Other Ambulatory Visit: Payer: Self-pay

## 2021-03-06 DIAGNOSIS — R1013 Epigastric pain: Secondary | ICD-10-CM | POA: Diagnosis present

## 2021-03-06 DIAGNOSIS — Z87891 Personal history of nicotine dependence: Secondary | ICD-10-CM | POA: Insufficient documentation

## 2021-03-06 DIAGNOSIS — I10 Essential (primary) hypertension: Secondary | ICD-10-CM | POA: Diagnosis not present

## 2021-03-06 DIAGNOSIS — K219 Gastro-esophageal reflux disease without esophagitis: Secondary | ICD-10-CM | POA: Diagnosis not present

## 2021-03-06 MED ORDER — ALUM & MAG HYDROXIDE-SIMETH 200-200-20 MG/5ML PO SUSP
30.0000 mL | Freq: Once | ORAL | Status: AC
  Administered 2021-03-06: 30 mL via ORAL
  Filled 2021-03-06: qty 30

## 2021-03-06 MED ORDER — PANTOPRAZOLE SODIUM 40 MG PO TBEC: 40.0000 mg | DELAYED_RELEASE_TABLET | Freq: Every day | ORAL | 1 refills | Status: DC

## 2021-03-06 MED ORDER — LIDOCAINE VISCOUS HCL 2 % MT SOLN
15.0000 mL | Freq: Once | OROMUCOSAL | Status: AC
  Administered 2021-03-06: 15 mL via ORAL
  Filled 2021-03-06: qty 15

## 2021-03-06 NOTE — ED Triage Notes (Signed)
Pt comes into the ED via POV c/o epigastric pain that started this morning at 3:00.  PT denies any chest pain or SHOB.  Pt took Mylanta at home.  Pt explains he takes pantoprazole normally but he ran out a week ago.  Pt explains he has come in for this before and gets a GI cocktail and feels better.  PT ambulatory and in NAD with even and unlabored respirations.

## 2021-03-06 NOTE — Discharge Instructions (Signed)
Resume Pantoprazole 40mg  daily. Return to the ER for worsening symptoms, persistent vomiting, difficulty breathing or other concerns.

## 2021-03-06 NOTE — ED Provider Notes (Signed)
Emergency Medicine Provider Triage Evaluation Note  Caton Popowski , a 50 y.o. male  was evaluated in triage.  Pt complains of epigastric pain.  This is the fourth episode.  Patient ran out of his pantoprazole.  Called his GI doctor for refill but GI doctor wants to see him first for probable endoscopy.  Review of Systems  Positive: Epigastric pain Negative: Nausea/vomiting  Physical Exam  There were no vitals taken for this visit. Gen:   Awake, mild distress   Resp:  Normal effort  MSK:   Moves extremities without difficulty  Other:  Epigastric tenderness to palpation  Medical Decision Making  Medically screening exam initiated at 6:01 AM.  Appropriate orders placed.  Chriss Czar Omura was informed that the remainder of the evaluation will be completed by another provider, this initial triage assessment does not replace that evaluation, and the importance of remaining in the ED until their evaluation is complete.  50 year old male presenting with epigastric discomfort which he relates to his GERD.  This is his fourth episode and he prefers to avoid blood work and imaging studies if possible.  He is requesting GI cocktail as he explains this has alleviated his symptoms previously.  Will trial GI cocktail and reconsider work-up if patient's symptoms do not alleviate.  Patient awaiting treatment room.   Irean Hong, MD 03/06/21 [phone]

## 2021-03-06 NOTE — ED Provider Notes (Signed)
[facility] Emergency Department Provider Note   ____________________________________________   Event Date/Time   First MD Initiated Contact with Patient 03/06/21 [phone]     (approximate)  I have reviewed the triage vital signs and the nursing notes.   HISTORY  Chief Complaint Abdominal Pain    HPI Justin Flowers is a 50 y.o. male who presents to the ED from home with a chief complaint of epigastric pain which began approximately 3 AM.  Patient has a history of GERD and recently ran out of his pantoprazole.  He called his GI doctor who wants to see him before he refills his medicines because it is likely he will need an endoscopy.  Patient states this is his fourth episode and is here just for GI cocktail.  Denies fever, cough, chest pain, shortness of breath, nausea, vomiting or dizziness.     Past Medical History:  Diagnosis Date   Hypertension     There are no problems to display for this patient.   Past Surgical History:  Procedure Laterality Date   ANKLE SURGERY      Prior to Admission medications   Medication Sig Start Date End Date Taking? Authorizing Provider  alum & mag hydroxide-simeth (MAALOX MAX) 400-400-40 MG/5ML suspension Take 5 mLs by mouth every 6 (six) hours as needed for indigestion. 11/11/20   Nita Sickle, MD  Buprenorphine HCl-Naloxone HCl 4-1 MG FILM Place 1 Film under the tongue every 12 (twelve) hours. 10/03/20   [provider]  dicyclomine (BENTYL) 10 MG capsule Take 1 capsule (10 mg total) by mouth 4 (four) times daily for 14 days. 11/11/20 11/25/20  Nita Sickle, MD  pantoprazole (PROTONIX) 40 MG tablet Take 1 tablet (40 mg total) by mouth daily. 03/06/21   Irean Hong, MD    Allergies Patient has no known allergies.  History reviewed. No pertinent family history.  Social History Social History   Tobacco Use   Smoking status: Former    Packs/day: 1.00    Years: 6.00    Pack years:  6.00    Types: Cigarettes    Quit date: 09/13/1995    Years since quitting: 25.4   Smokeless tobacco: Current  Substance Use Topics   Alcohol use: Not Currently    Alcohol/week: 0.0 standard drinks   Drug use: Never    Review of Systems  Constitutional: No fever/chills Eyes: No visual changes. ENT: No sore throat. Cardiovascular: Denies chest pain. Respiratory: Denies shortness of breath. Gastrointestinal: Positive for abdominal pain.  No nausea, no vomiting.  No diarrhea.  No constipation. Genitourinary: Negative for dysuria. Musculoskeletal: Negative for back pain. Skin: Negative for rash. Neurological: Negative for headaches, focal weakness or numbness.   ____________________________________________   PHYSICAL EXAM:  VITAL SIGNS: ED Triage Vitals  Enc Vitals Group     BP 03/06/21 0604 (!) 147/92     Pulse Rate 03/06/21 0604 79     Resp 03/06/21 0604 18     Temp 03/06/21 0604 98 F (36.7 C)     Temp Source 03/06/21 0604 Oral     SpO2 03/06/21 0604 98 %     Weight 03/06/21 0605 214 lb 15.2 oz (97.5 kg)     Height 03/06/21 0605 6' (1.829 m)     Head Circumference --      Peak Flow --      Pain Score 03/06/21 0604 4     Pain Loc --      Pain Edu? --  Excl. in GC? --     Constitutional: Alert and oriented. Well appearing and in no acute distress. Eyes: Conjunctivae are normal. PERRL. EOMI. Head: Atraumatic. Nose: No congestion/rhinnorhea. Mouth/Throat: Mucous membranes are moist.  Oropharynx non-erythematous. Neck: No stridor.   Cardiovascular: Normal rate, regular rhythm. Grossly normal heart sounds.  Good peripheral circulation. Respiratory: Normal respiratory effort.  No retractions. Lungs CTAB. Gastrointestinal: Soft and minimally tender to palpation epigastrium without rebound or guarding. No distention. No abdominal bruits. No CVA tenderness. Musculoskeletal: No lower extremity tenderness nor edema.  No joint effusions. Neurologic:  Normal speech and  language. No gross focal neurologic deficits are appreciated. No gait instability. Skin:  Skin is warm, dry and intact. No rash noted. Psychiatric: Mood and affect are normal. Speech and behavior are normal.  ____________________________________________   LABS (all labs ordered are listed, but only abnormal results are displayed)  Labs Reviewed - No data to display ____________________________________________  EKG  None ____________________________________________  RADIOLOGY I, Arielis Leonhart J, personally viewed and evaluated these images (plain radiographs) as part of my medical decision making, as well as reviewing the written report by the radiologist.  ED MD interpretation: None  Official radiology report(s): No results found.  ____________________________________________   PROCEDURES  Procedure(s) performed (including Critical Care):  Procedures   ____________________________________________   INITIAL IMPRESSION / ASSESSMENT AND PLAN / ED COURSE  As part of my medical decision making, I reviewed the following data within the electronic MEDICAL RECORD NUMBER Nursing notes reviewed and incorporated, Old chart reviewed, and Notes from prior ED visits     50 year old male presenting with epigastric discomfort, history of GERD out of pantoprazole. Differential diagnosis includes, but is not limited to, biliary disease (biliary colic, acute cholecystitis, cholangitis, choledocholithiasis, etc), intrathoracic causes for epigastric abdominal pain including ACS, gastritis, duodenitis, pancreatitis, small bowel or large bowel obstruction, abdominal aortic aneurysm, hernia, and ulcer(s).   Patient declines lab work or imaging studies.  States this is his fourth visit for the same and is requesting GI cocktail.  Will administer GI cocktail but cautioned patient he may need further work-up if his symptoms do not alleviate.  Patient felt much better approximately 20 minutes after GI  cocktail administration.  Symptoms completely resolved.  Will refill pantoprazole and patient will follow up with his GI doctor.  Strict return precautions given.  Patient verbalizes understanding agrees with plan of care.      ____________________________________________   FINAL CLINICAL IMPRESSION(S) / ED DIAGNOSES  Final diagnoses:  Epigastric pain  Gastroesophageal reflux disease, unspecified whether esophagitis present     ED Discharge Orders          Ordered    pantoprazole (PROTONIX) 40 MG tablet  Daily        03/06/21 3244             Note:  This document was prepared using Dragon voice recognition software and may include unintentional dictation errors.    Irean Hong, MD 03/07/21 [phone]

## 2021-05-15 ENCOUNTER — Other Ambulatory Visit: Payer: Self-pay

## 2021-05-15 ENCOUNTER — Encounter: Payer: Self-pay | Admitting: Emergency Medicine

## 2021-05-15 ENCOUNTER — Emergency Department
Admission: EM | Admit: 2021-05-15 | Discharge: 2021-05-15 | Disposition: A | Discharge status: Home / Self Care | Payer: 59 | Attending: Emergency Medicine | Admitting: Emergency Medicine

## 2021-05-15 DIAGNOSIS — K219 Gastro-esophageal reflux disease without esophagitis: Secondary | ICD-10-CM | POA: Diagnosis not present

## 2021-05-15 DIAGNOSIS — R1013 Epigastric pain: Secondary | ICD-10-CM

## 2021-05-15 DIAGNOSIS — I10 Essential (primary) hypertension: Secondary | ICD-10-CM | POA: Insufficient documentation

## 2021-05-15 MED ORDER — LIDOCAINE VISCOUS HCL 2 % MT SOLN
15.0000 mL | Freq: Once | OROMUCOSAL | Status: AC
  Administered 2021-05-15: 15 mL via ORAL
  Filled 2021-05-15: qty 15

## 2021-05-15 MED ORDER — ALUM & MAG HYDROXIDE-SIMETH 200-200-20 MG/5ML PO SUSP
30.0000 mL | Freq: Once | ORAL | Status: AC
  Administered 2021-05-15: 30 mL via ORAL
  Filled 2021-05-15: qty 30

## 2021-05-15 MED ORDER — SUCRALFATE 1 GM/10ML PO SUSP: 1.0000 g | Freq: Four times a day (QID) | ORAL | 1 refills | Status: DC

## 2021-05-15 NOTE — ED Triage Notes (Signed)
Pt to triage via w/c, appears uncomfortable, unable to stand uprite; st upper abd pain radiating into back x 5hrs with no accomp symptoms; st hx of same and received GI cocktail for such; took protonix and antispasmodic without relief

## 2021-05-15 NOTE — Discharge Instructions (Signed)
Add Carafate to the medicines you are already taking.  Return to the ER for worsening symptoms, persistent vomiting, difficulty breathing or other concerns.

## 2021-05-15 NOTE — ED Provider Notes (Signed)
[facility] Provider Note    Event Date/Time   First MD Initiated Contact with Patient 05/15/21 0129     (approximate)   History   Abdominal Pain   HPI  Justin Flowers is a 51 y.o. male who presents to the ED from home with a chief complaint of epigastric pain.  Patient with a history of GERD, followed by GI and is on daily Protonix.  Complains of epigastric discomfort x5 hours.  Similar symptoms previously for which patient is alleviated by GI cocktail in the ER.  Denies fever, cough, chest pain, shortness of breath, nausea or vomiting.     Past Medical History   Past Medical History:  Diagnosis Date   Hypertension      Active Problem List  There are no problems to display for this patient.    Past Surgical History   Past Surgical History:  Procedure Laterality Date   ANKLE SURGERY       Home Medications   Prior to Admission medications   Medication Sig Start Date End Date Taking? Authorizing Provider  sucralfate (CARAFATE) 1 GM/10ML suspension Take 10 mLs (1 g total) by mouth 4 (four) times daily. 05/15/21  Yes Irean Hong, MD  alum & mag hydroxide-simeth (MAALOX MAX) 400-400-40 MG/5ML suspension Take 5 mLs by mouth every 6 (six) hours as needed for indigestion. 11/11/20   Nita Sickle, MD  Buprenorphine HCl-Naloxone HCl 4-1 MG FILM Place 1 Film under the tongue every 12 (twelve) hours. 10/03/20   [provider]  dicyclomine (BENTYL) 10 MG capsule Take 1 capsule (10 mg total) by mouth 4 (four) times daily for 14 days. 11/11/20 11/25/20  Nita Sickle, MD  pantoprazole (PROTONIX) 40 MG tablet Take 1 tablet (40 mg total) by mouth daily. 03/06/21   Irean Hong, MD     Allergies  Patient has no known allergies.   Family History  No family history on file.   Physical Exam  Triage Vital Signs: ED Triage Vitals  Enc Vitals Group     BP 05/15/21 0113 (!) 136/100     Pulse Rate 05/15/21 0113 (!) 103     Resp  --      Temp --      Temp src --      SpO2 05/15/21 0113 96 %     Weight 05/15/21 0116 200 lb (90.7 kg)     Height 05/15/21 0116 6' (1.829 m)     Head Circumference --      Peak Flow --      Pain Score 05/15/21 0115 10     Pain Loc --      Pain Edu? --      Excl. in GC? --     Updated Vital Signs: BP (!) 133/92    Pulse 99    Temp 98 F (36.7 C) (Oral)    Resp 20    Ht 6' (1.829 m)    Wt 90.7 kg    SpO2 98%    BMI 27.12 kg/m    General: Awake, mild to moderate distress.  CV:  Good peripheral perfusion.  Resp:  Normal effort.  CTAB. Abd:  Epigastrium mildly tender to palpation without rebound or guarding. No distention.  No bruits.  No CVA tenderness Other:  No vesicles.   ED Results / Procedures / Treatments  Labs (all labs ordered are listed, but only abnormal results are displayed) Labs Reviewed - No data to display  EKG  ED ECG REPORT I, Dajanae Brophy J, the attending physician, personally viewed and interpreted this ECG.   Date: 05/15/2021  EKG Time: 0121  Rate: 76  Rhythm: normal sinus rhythm  Axis: Normal  Intervals:none  ST&T Change: Nonspecific    RADIOLOGY None   Official radiology report(s): No results found.   PROCEDURES:  Critical Care performed: No  Procedures   MEDICATIONS ORDERED IN ED: Medications  alum & mag hydroxide-simeth (MAALOX/MYLANTA) 200-200-20 MG/5ML suspension 30 mL (30 mLs Oral Given 05/15/21 0209)    And  lidocaine (XYLOCAINE) 2 % viscous mouth solution 15 mL (15 mLs Oral Given 05/15/21 0209)     IMPRESSION / MDM / ASSESSMENT AND PLAN / ED COURSE  I reviewed the triage vital signs and the nursing notes.                             51 year old male presenting with epigastric pain. Differential diagnosis includes, but is not limited to, biliary disease (biliary colic, acute cholecystitis, cholangitis, choledocholithiasis, etc), intrathoracic causes for epigastric abdominal pain including ACS, gastritis, duodenitis,  pancreatitis, small bowel or large bowel obstruction, abdominal aortic aneurysm, hernia, and ulcer(s).  I have personally reviewed patient's records and see that patient sees PA Scalp Level from [facility] for GI.  Patient declines lab work or imaging studies.  Reports GI cocktail usually alleviates his symptoms.  Will administer GI cocktail and reassess.   Clinical Course as of 05/15/21 0316  Thu May 15, 2021  [id number] Symptoms improved after GI cocktail.  Patient already on Protonix and Pepcid, will add Carafate.  He will follow-up with his GI doctor.  He has never had an endoscopy; I strongly encouraged him to have one.  Strict return precautions given.  Patient verbalizes understanding and agrees with plan of care. [JS]    Clinical Course User Index [JS] Irean Hong, MD     FINAL CLINICAL IMPRESSION(S) / ED DIAGNOSES   Final diagnoses:  Epigastric pain  Gastroesophageal reflux disease, unspecified whether esophagitis present     Rx / DC Orders   ED Discharge Orders          Ordered    sucralfate (CARAFATE) 1 GM/10ML suspension  4 times daily        05/15/21 0314             Note:  This document was prepared using Dragon voice recognition software and may include unintentional dictation errors.   Irean Hong, MD 05/15/21 [phone]

## 2021-05-22 ENCOUNTER — Other Ambulatory Visit: Payer: Self-pay | Admitting: Internal Medicine

## 2021-06-14 ENCOUNTER — Other Ambulatory Visit: Payer: Self-pay

## 2021-06-14 ENCOUNTER — Emergency Department
Admission: EM | Admit: 2021-06-14 | Discharge: 2021-06-14 | Disposition: A | Discharge status: Home / Self Care | Payer: 59 | Attending: Emergency Medicine | Admitting: Emergency Medicine

## 2021-06-14 DIAGNOSIS — R1010 Upper abdominal pain, unspecified: Secondary | ICD-10-CM

## 2021-06-14 DIAGNOSIS — I10 Essential (primary) hypertension: Secondary | ICD-10-CM | POA: Diagnosis not present

## 2021-06-14 DIAGNOSIS — Z79899 Other long term (current) drug therapy: Secondary | ICD-10-CM | POA: Diagnosis not present

## 2021-06-14 DIAGNOSIS — R1013 Epigastric pain: Secondary | ICD-10-CM | POA: Diagnosis present

## 2021-06-14 DIAGNOSIS — R0602 Shortness of breath: Secondary | ICD-10-CM | POA: Insufficient documentation

## 2021-06-14 DIAGNOSIS — R1011 Right upper quadrant pain: Secondary | ICD-10-CM | POA: Insufficient documentation

## 2021-06-14 MED ORDER — PANTOPRAZOLE SODIUM 40 MG PO TBEC: 40.0000 mg | DELAYED_RELEASE_TABLET | Freq: Every day | ORAL | 1 refills | Status: DC

## 2021-06-14 MED ORDER — ONDANSETRON 4 MG PO TBDP: 4.0000 mg | ORAL_TABLET | Freq: Four times a day (QID) | ORAL | 0 refills | Status: DC | PRN

## 2021-06-14 MED ORDER — LIDOCAINE VISCOUS HCL 2 % MT SOLN: 15.0000 mL | OROMUCOSAL | 1 refills | Status: DC | PRN

## 2021-06-14 MED ORDER — ALUM & MAG HYDROXIDE-SIMETH 200-200-20 MG/5ML PO SUSP
30.0000 mL | Freq: Once | ORAL | Status: AC
  Administered 2021-06-14: 30 mL via ORAL
  Filled 2021-06-14: qty 30

## 2021-06-14 MED ORDER — LIDOCAINE VISCOUS HCL 2 % MT SOLN
15.0000 mL | Freq: Once | OROMUCOSAL | Status: AC
  Administered 2021-06-14: 15 mL via ORAL
  Filled 2021-06-14: qty 15

## 2021-06-14 NOTE — ED Provider Notes (Signed)
[facility] Provider Note    Event Date/Time   First MD Initiated Contact with Patient 06/14/21 [phone]     (approximate)   History   Abdominal Pain   HPI  Justin Flowers is a 51 y.o. male with history of hypertension who presents to the emergency department with epigastric abdominal pain that is sharp, severe in nature that radiates into his back that started this evening and progressively worsened.  States he has had these episodes multiple times in the past over the past several years.  States his symptoms normally improve here with a GI cocktail.  He has taken omeprazole, Protonix and Maalox at home without relief.  He states he has had some shortness of breath and "stuttering breathing".  States he has had these episodes since having COVID-19.  He denies any chest pain or chest discomfort.  No vomiting, diarrhea, bloody stool, melena.  No previous abdominal surgery.  States he is supposed to see gastroenterology next month.  He reports he has been diagnosed with H. pylori before and took a course of antibiotics but never followed up to be rechecked.  He does take NSAIDs occasionally.  He states he avoids alcohol, spicy or acidic foods.  He is not a smoker.  He does not know what triggers these episodes but states they normally come on in the middle of the night.  He states he has an appoint with his primary care doctor this Monday.   History provided by patient.    Past Medical History:  Diagnosis Date   Hypertension     Past Surgical History:  Procedure Laterality Date   ANKLE SURGERY      MEDICATIONS:  Prior to Admission medications   Medication Sig Start Date End Date Taking? Authorizing Provider  alum & mag hydroxide-simeth (MAALOX MAX) 400-400-40 MG/5ML suspension Take 5 mLs by mouth every 6 (six) hours as needed for indigestion. 11/11/20   Nita SickleVeronese, [place], MD  Buprenorphine HCl-Naloxone HCl 4-1 MG FILM Place 1 Film under the tongue every  12 (twelve) hours. 10/03/20   [provider]  dicyclomine (BENTYL) 10 MG capsule Take 1 capsule (10 mg total) by mouth 4 (four) times daily for 14 days. 11/11/20 11/25/20  Nita SickleVeronese, [place], MD  pantoprazole (PROTONIX) 40 MG tablet Take 1 tablet (40 mg total) by mouth daily. 03/06/21   Irean HongSung, Jade J, MD  sucralfate (CARAFATE) 1 GM/10ML suspension Take 10 mLs (1 g total) by mouth 4 (four) times daily. 05/15/21   Irean HongSung, Jade J, MD    Physical Exam   Triage Vital Signs: ED Triage Vitals  Enc Vitals Group     BP 06/14/21 0449 (!) 145/62     Pulse Rate 06/14/21 0449 85     Resp --      Temp 06/14/21 0449 98 F (36.7 C)     Temp Source 06/14/21 0449 Oral     SpO2 06/14/21 0449 97 %     Weight --      Height --      Head Circumference --      Peak Flow --      Pain Score 06/14/21 0451 7     Pain Loc --      Pain Edu? --      Excl. in GC? --     Most recent vital signs: Vitals:   06/14/21 0449 06/14/21 0532  BP: (!) 145/62   Pulse: 85   Resp:  20  Temp: 98  F (36.7 C)   SpO2: 97%     CONSTITUTIONAL: Alert and oriented and responds appropriately to questions. Well-appearing; well-nourished HEAD: Normocephalic, atraumatic EYES: Conjunctivae clear, pupils appear equal, sclera nonicteric ENT: normal nose; moist mucous membranes NECK: Supple, normal ROM CARD: RRR; S1 and S2 appreciated; no murmurs, no clicks, no rubs, no gallops RESP: Normal chest excursion without splinting or tachypnea; breath sounds clear and equal bilaterally; no wheezes, no rhonchi, no rales, no hypoxia or respiratory distress, speaking full sentences ABD/GI: Normal bowel sounds; non-distended; soft, non-tender, no rebound, no guarding, no peritoneal signs BACK: The back appears normal EXT: Normal ROM in all joints; no deformity noted, no edema; no cyanosis SKIN: Normal color for age and race; warm; no rash on exposed skin NEURO: Moves all extremities equally, normal speech PSYCH: The patient's mood and  manner are appropriate.   ED Results / Procedures / Treatments   LABS: (all labs ordered are listed, but only abnormal results are displayed) Labs Reviewed - No data to display   EKG:   RADIOLOGY: My personal review and interpretation of imaging:    I have personally reviewed all radiology reports.   No results found.   PROCEDURES:  Critical Care performed: No   CRITICAL CARE Performed by: Pryor Curia   Total critical care time: 0 minutes  Critical care time was exclusive of separately billable procedures and treating other patients.  Critical care was necessary to treat or prevent imminent or life-threatening deterioration.  Critical care was time spent personally by me on the following activities: development of treatment plan with patient and/or surrogate as well as nursing, discussions with consultants, evaluation of patient's response to treatment, examination of patient, obtaining history from patient or surrogate, ordering and performing treatments and interventions, ordering and review of laboratory studies, ordering and review of radiographic studies, pulse oximetry and re-evaluation of patient's condition.   Procedures    IMPRESSION / MDM / ASSESSMENT AND PLAN / ED COURSE  I reviewed the triage vital signs and the nursing notes.    Patient here with upper abdominal pain.  History of the same.     DIFFERENTIAL DIAGNOSIS (includes but not limited to):   GERD, gastritis, esophagitis, esophageal spasm, peptic ulcer disease, H. pylori, cholecystitis, cholelithiasis, pancreatitis, cholangitis, less likely ACS, PE, dissection, hiatal hernia   PLAN: Have offered labs, chest x-ray, EKG, abdominal ultrasound multiple times to patient.  He states he has been seen for the same before.  Reviewed his records from 2020 when he had a normal right upper quadrant ultrasound other than having some hepatic steatosis but gallbladder was normal.  It also appears he was  here in July 2022 for the same and had reassuring labs and CT of his abdomen pelvis.  Patient declines work-up today and states that he would just like to see how a GI cocktail works for him.  We will give GI cocktail and reassess.   MEDICATIONS GIVEN IN ED: Medications  alum & mag hydroxide-simeth (MAALOX/MYLANTA) 200-200-20 MG/5ML suspension 30 mL (30 mLs Oral Given 06/14/21 0458)    And  lidocaine (XYLOCAINE) 2 % viscous mouth solution 15 mL (15 mLs Oral Given 06/14/21 0458)     ED COURSE: Patient states his pain has now resolved on this instantaneously after GI cocktail.  Again offered further work-up but he states he has follow-up with his PCP on Monday and has an appointment with gastroenterology in a month.  He does not want further work-up in the ED today.  I have advised him to avoid NSAIDs and encouraged him to avoid spicy, acidic, greasy foods.  We discussed avoiding caffeine and alcohol.  We will have him continue his Protonix 40 mg daily and will discharge with a prescription for viscous lidocaine.  Discussed at length return precautions.   At this time, I do not feel there is any life-threatening condition present. I reviewed all nursing notes, vitals, pertinent previous records.  All lab and urine results, EKGs, imaging ordered have been independently reviewed and interpreted by myself.  I reviewed all available radiology reports from any imaging ordered this visit.  Based on my assessment, I feel the patient is safe to be discharged home without further emergent workup and can continue workup as an outpatient as needed. Discussed all findings, treatment plan as well as usual and customary return precautions with patient.  They verbalize understanding and are comfortable with this plan.  Outpatient follow-up has been provided as needed.  All questions have been answered.    CONSULTS: No emergent consultation needed at this time given patient's pain resolved after GI cocktail and patient  refuses further emergent work-up.   OUTSIDE RECORDS REVIEWED: Reviewed patient's last gastroenterology note with Gerarda Gunther on 02/14/2019.         FINAL CLINICAL IMPRESSION(S) / ED DIAGNOSES   Final diagnoses:  Upper abdominal pain     Rx / DC Orders   ED Discharge Orders          Ordered    lidocaine (XYLOCAINE) 2 % solution  Every 4 hours PRN        06/14/21 0521    ondansetron (ZOFRAN-ODT) 4 MG disintegrating tablet  Every 6 hours PRN        06/14/21 0521    pantoprazole (PROTONIX) 40 MG tablet  Daily        06/14/21 0521             Note:  This document was prepared using Dragon voice recognition software and may include unintentional dictation errors.   Misty Rago, Delice Bison, DO 06/14/21 [phone]

## 2021-06-14 NOTE — Discharge Instructions (Signed)
I recommend close follow-up with your primary care provider as well as your gastroenterologist.  Please continue your Protonix as prescribed. ? ?If you begin having worsening pain, chest pain, shortness of breath, fever, vomiting blood or see coffee-ground appearing vomit, blood in your stool or black and tarry stools, please return to the emergency department. ? ? ?Please avoid NSAIDs such as aspirin (Goody powders), ibuprofen (Motrin, Advil), naproxen (Aleve) as these may worsen your symptoms.  Tylenol 1000 mg every 6 hours is safe to take as long as you have no history of liver problems (heavy alcohol use, cirrhosis, hepatitis).  Please avoid spicy, acidic (citrus fruits, tomato based sauces, salsa), greasy, fatty foods.  Please avoid caffeine and alcohol.  Smoking can also make GERD/acid reflux worse.  Over the counter medications such as TUMS, Maalox or Mylanta, pepcid, Prilosec or Nexium may help with your symptoms.  Do not take Prilosec or Nexium if you are already prescribed a proton pump inhibitor. ? ?

## 2021-06-14 NOTE — ED Triage Notes (Signed)
Ambulatory to triage for c/o epigastric pain. States frequent hx of same, normally treated with viscous lidocaine with relief. Denies nausea/vomiting ?

## 2022-01-17 ENCOUNTER — Emergency Department: Payer: 59

## 2022-01-17 ENCOUNTER — Other Ambulatory Visit: Payer: Self-pay

## 2022-01-17 ENCOUNTER — Emergency Department
Admission: EM | Admit: 2022-01-17 | Discharge: 2022-01-17 | Disposition: A | Discharge status: Home / Self Care | Payer: 59 | Attending: Emergency Medicine | Admitting: Emergency Medicine

## 2022-01-17 ENCOUNTER — Encounter: Payer: Self-pay | Admitting: Emergency Medicine

## 2022-01-17 DIAGNOSIS — D72829 Elevated white blood cell count, unspecified: Secondary | ICD-10-CM | POA: Insufficient documentation

## 2022-01-17 DIAGNOSIS — K219 Gastro-esophageal reflux disease without esophagitis: Secondary | ICD-10-CM | POA: Insufficient documentation

## 2022-01-17 DIAGNOSIS — I10 Essential (primary) hypertension: Secondary | ICD-10-CM | POA: Diagnosis not present

## 2022-01-17 DIAGNOSIS — R1013 Epigastric pain: Secondary | ICD-10-CM

## 2022-01-17 LAB — COMPREHENSIVE METABOLIC PANEL
ALT: 24 U/L (ref 0–44)
AST: 22 U/L (ref 15–41)
Albumin: 4.5 g/dL (ref 3.5–5.0)
Alkaline Phosphatase: 55 U/L (ref 38–126)
Anion gap: 11 (ref 5–15)
BUN: 13 mg/dL (ref 6–20)
CO2: 28 mmol/L (ref 22–32)
Calcium: 9.3 mg/dL (ref 8.9–10.3)
Chloride: 98 mmol/L (ref 98–111)
Creatinine, Ser: 1.32 mg/dL — ABNORMAL HIGH (ref 0.61–1.24)
GFR, Estimated: >60 mL/min (ref >60)
Glucose, Bld: 153 mg/dL — ABNORMAL HIGH (ref 70–99)
Potassium: 3.9 mmol/L (ref 3.5–5.1)
Sodium: 137 mmol/L (ref 135–145)
Total Bilirubin: 2.1 mg/dL — ABNORMAL HIGH (ref 0.3–1.2)
Total Protein: 7.8 g/dL (ref 6.5–8.1)

## 2022-01-17 LAB — CBC WITH DIFFERENTIAL/PLATELET
Abs Immature Granulocytes: 0.06 10*3/uL (ref 0.00–0.07)
Basophils Absolute: 0.1 10*3/uL (ref 0.0–0.1)
Basophils Relative: 1 %
Eosinophils Absolute: 0.1 10*3/uL (ref 0.0–0.5)
Eosinophils Relative: 1 %
HCT: 51.6 % (ref 39.0–52.0)
Hemoglobin: 16.8 g/dL (ref 13.0–17.0)
Immature Granulocytes: 1 %
Lymphocytes Relative: 18 %
Lymphs Abs: 2.2 10*3/uL (ref 0.7–4.0)
MCH: 27 pg (ref 26.0–34.0)
MCHC: 32.6 g/dL (ref 30.0–36.0)
MCV: 83 fL (ref 80.0–100.0)
Monocytes Absolute: 0.6 10*3/uL (ref 0.1–1.0)
Monocytes Relative: 5 %
Neutro Abs: 9 10*3/uL — ABNORMAL HIGH (ref 1.7–7.7)
Neutrophils Relative %: 74 %
Platelets: 274 10*3/uL (ref 150–400)
RBC: 6.22 MIL/uL — ABNORMAL HIGH (ref 4.22–5.81)
RDW: 13 % (ref 11.5–15.5)
WBC: 12.2 10*3/uL — ABNORMAL HIGH (ref 4.0–10.5)
nRBC: 0 % (ref 0.0–0.2)

## 2022-01-17 LAB — LIPASE, BLOOD: Lipase: 30 U/L (ref 11–51)

## 2022-01-17 LAB — TROPONIN I (HIGH SENSITIVITY): Troponin I (High Sensitivity): 3 ng/L (ref <18)

## 2022-01-17 MED ORDER — LIDOCAINE VISCOUS HCL 2 % MT SOLN
15.0000 mL | Freq: Once | OROMUCOSAL | Status: AC
  Administered 2022-01-17: 15 mL via OROMUCOSAL
  Filled 2022-01-17: qty 15

## 2022-01-17 MED ORDER — HYDROMORPHONE HCL 1 MG/ML IJ SOLN
1.0000 mg | Freq: Once | INTRAMUSCULAR | Status: AC
  Administered 2022-01-17: 1 mg via INTRAVENOUS
  Filled 2022-01-17: qty 1

## 2022-01-17 MED ORDER — SUCRALFATE 1 GM/10ML PO SUSP: 1.0000 g | Freq: Four times a day (QID) | ORAL | 1 refills | Status: DC

## 2022-01-17 MED ORDER — ONDANSETRON HCL 4 MG/2ML IJ SOLN
4.0000 mg | Freq: Once | INTRAMUSCULAR | Status: AC
  Administered 2022-01-17: 4 mg via INTRAVENOUS
  Filled 2022-01-17: qty 2

## 2022-01-17 MED ORDER — OXYCODONE-ACETAMINOPHEN 5-325 MG PO TABS: 1.0000 | ORAL_TABLET | ORAL | 0 refills | Status: DC | PRN

## 2022-01-17 MED ORDER — OXYCODONE-ACETAMINOPHEN 5-325 MG PO TABS
2.0000 | ORAL_TABLET | Freq: Once | ORAL | Status: AC
  Administered 2022-01-17: 2 via ORAL
  Filled 2022-01-17: qty 2

## 2022-01-17 MED ORDER — ONDANSETRON 4 MG PO TBDP: 4.0000 mg | ORAL_TABLET | Freq: Three times a day (TID) | ORAL | 0 refills | Status: DC | PRN

## 2022-01-17 MED ORDER — ALUM & MAG HYDROXIDE-SIMETH 200-200-20 MG/5ML PO SUSP
30.0000 mL | Freq: Once | ORAL | Status: AC
  Administered 2022-01-17: 30 mL via ORAL
  Filled 2022-01-17: qty 30

## 2022-01-17 MED ORDER — FAMOTIDINE IN NACL 20-0.9 MG/50ML-% IV SOLN
20.0000 mg | Freq: Once | INTRAVENOUS | Status: AC
  Administered 2022-01-17: 20 mg via INTRAVENOUS
  Filled 2022-01-17: qty 50

## 2022-01-17 MED ORDER — HYDROMORPHONE HCL 1 MG/ML IJ SOLN
0.5000 mg | Freq: Once | INTRAMUSCULAR | Status: AC
  Administered 2022-01-17: 0.5 mg via INTRAVENOUS
  Filled 2022-01-17: qty 0.5

## 2022-01-17 MED ORDER — FAMOTIDINE 20 MG PO TABS: 20.0000 mg | ORAL_TABLET | Freq: Two times a day (BID) | ORAL | 0 refills | Status: DC

## 2022-01-17 NOTE — ED Provider Notes (Signed)
[facility] Provider Note    Event Date/Time   First MD Initiated Contact with Patient 01/17/22 [phone]     (approximate)   History   Abdominal Pain   HPI  Justin Flowers is a 51 y.o. male who presents to the ED from home with a chief complaint of epigastric pain.  Patient with a history of GERD with several ED visits for which he declines lab work and improved after GI cocktail.  Reports epigastric pain around 10 PM after eating [place] food.  Denies fever, chills, chest pain, shortness of breath, nausea/vomiting or dizziness.  States he had an endoscopy since he last came to the emergency department and is currently on Protonix.     Past Medical History   Past Medical History:  Diagnosis Date   Hypertension   GERD   Active Problem List  There are no problems to display for this patient.    Past Surgical History   Past Surgical History:  Procedure Laterality Date   ANKLE SURGERY       Home Medications   Prior to Admission medications   Medication Sig Start Date End Date Taking? Authorizing Provider  famotidine (PEPCID) 20 MG tablet Take 1 tablet (20 mg total) by mouth 2 (two) times daily. 01/17/22  Yes Irean Hong, MD  ondansetron (ZOFRAN-ODT) 4 MG disintegrating tablet Take 1 tablet (4 mg total) by mouth every 8 (eight) hours as needed for nausea or vomiting. 01/17/22  Yes Irean Hong, MD  oxyCODONE-acetaminophen (PERCOCET/ROXICET) 5-325 MG tablet Take 1 tablet by mouth every 4 (four) hours as needed for severe pain. 01/17/22  Yes Irean Hong, MD  sucralfate (CARAFATE) 1 GM/10ML suspension Take 10 mLs (1 g total) by mouth 4 (four) times daily. 01/17/22  Yes Irean Hong, MD  alum & mag hydroxide-simeth (MAALOX MAX) 400-400-40 MG/5ML suspension Take 5 mLs by mouth every 6 (six) hours as needed for indigestion. 11/11/20   Nita Sickle, MD  Buprenorphine HCl-Naloxone HCl 4-1 MG FILM Place 1 Film under the tongue every 12 (twelve)  hours. 10/03/20   [provider]  lidocaine (XYLOCAINE) 2 % solution Use as directed 15 mLs in the mouth or throat every 4 (four) hours as needed (For upper abdominal pain). 06/14/21   Ward, Layla Maw, DO  pantoprazole (PROTONIX) 40 MG tablet Take 1 tablet (40 mg total) by mouth daily. 03/06/21   Irean Hong, MD  pantoprazole (PROTONIX) 40 MG tablet Take 1 tablet (40 mg total) by mouth daily. 06/14/21 06/14/22  Ward, Layla Maw, DO     Allergies  Patient has no known allergies.   Family History  No family history on file.   Physical Exam  Triage Vital Signs: ED Triage Vitals  Enc Vitals Group     BP 01/17/22 0256 (!) 126/101     Pulse Rate 01/17/22 0256 (!) 59     Resp --      Temp 01/17/22 0256 98.1 F (36.7 C)     Temp Source 01/17/22 0256 Oral     SpO2 01/17/22 0256 95 %     Weight 01/17/22 0257 200 lb (90.7 kg)     Height 01/17/22 0257 6' (1.829 m)     Head Circumference --      Peak Flow --      Pain Score 01/17/22 0256 10     Pain Loc --      Pain Edu? --  Excl. in GC? --     Updated Vital Signs: BP (!) 131/98   Pulse 79   Temp 98.1 F (36.7 C) (Oral)   Resp 15   Ht 6' (1.829 m)   Wt 90.7 kg   SpO2 98%   BMI 27.12 kg/m    General: Awake, moderate distress.  Appears uncomfortable. CV:  RRR.  Good peripheral perfusion.  Resp:  Normal effort.  CTA B. Abd:  Moderate epigastric tenderness to palpation without rebound or guarding.  No distention.  Other:  No truncal vesicles.   ED Results / Procedures / Treatments  Labs (all labs ordered are listed, but only abnormal results are displayed) Labs Reviewed  CBC WITH DIFFERENTIAL/PLATELET - Abnormal; Notable for the following components:      Result Value   WBC 12.2 (*)    RBC 6.22 (*)    Neutro Abs 9.0 (*)    All other components within normal limits  COMPREHENSIVE METABOLIC PANEL - Abnormal; Notable for the following components:   Glucose, Bld 153 (*)    Creatinine, Ser 1.32 (*)    Total  Bilirubin 2.1 (*)    All other components within normal limits  LIPASE, BLOOD  TROPONIN I (HIGH SENSITIVITY)     EKG  ED ECG REPORT I, Danetra Glock J, the attending physician, personally viewed and interpreted this ECG.   Date: 01/17/2022  EKG Time: 0249  Rate: 89  Rhythm: normal sinus rhythm  Axis: Normal  Intervals:none  ST&T Change: Nonspecific    RADIOLOGY I have independently visualized and interpreted patient's x-ray and ultrasound as well as noted the radiology interpretation:  X-ray: Shallow lung volumes, otherwise no acute cardiopulmonary process  Ultrasound: Gallbladder sludge without evidence of cholecystitis  Official radiology report(s): US ABDOMEN LIMITED RUQ (LIVER/GB)  Result Date: 01/17/2022 CLINICAL DATA:  51 year old male with postprandial upper abdominal pain. EXAM: ULTRASOUND ABDOMEN LIMITED RIGHT UPPER QUADRANT COMPARISON:  CT Abdomen and Pelvis 11/10/2020. Abdomen ultrasound 02/06/2019. FINDINGS: Gallbladder: Layering sludge within the gallbladder. Gallbladder wall thickness remains within normal limits. No shadowing echogenic stones identified. No pericholecystic fluid. No sonographic Murphy sign elicited. Common bile duct: Diameter: 2-3 mm, normal. Liver: Chronically echogenic liver (image 27) and some hepatic steatosis demonstrated by CT last year. No discrete liver lesion. No intrahepatic biliary ductal dilatation. Portal vein is patent on color Doppler imaging with normal direction of blood flow towards the liver. Other: Negative visible right kidney.  No free fluid. IMPRESSION: 1. Gallbladder sludge but no evidence of stones or acute cholecystitis. 2. Chronic fatty liver disease. No evidence of bile duct obstruction. Electronically Signed   By: Odessa Fleming M.D.   On: 01/17/2022 05:26   DG Chest Port 1 View  Result Date: 01/17/2022 CLINICAL DATA:  51 year old male with history of epigastric pain. EXAM: PORTABLE CHEST 1 VIEW COMPARISON:  No priors. FINDINGS:  Lung volumes are low. No consolidative airspace disease. No pleural effusions. No pneumothorax. No pulmonary nodule or mass noted. Pulmonary vasculature and the cardiomediastinal silhouette are within normal limits. IMPRESSION: Low lung volumes without radiographic evidence of acute cardiopulmonary disease. Electronically Signed   By: Trudie Reed M.D.   On: 01/17/2022 05:15     PROCEDURES:  Critical Care performed: No  .1-3 Lead EKG Interpretation  Performed by: Irean Hong, MD Authorized by: Irean Hong, MD     Interpretation: normal     ECG rate:  60   ECG rate assessment: normal     Rhythm: sinus rhythm  Ectopy: none     Conduction: normal   Comments:     Patient placed on cardiac monitor to evaluate for arrhythmias    MEDICATIONS ORDERED IN ED: Medications  alum & mag hydroxide-simeth (MAALOX/MYLANTA) 200-200-20 MG/5ML suspension 30 mL (30 mLs Oral Given 01/17/22 0302)  lidocaine (XYLOCAINE) 2 % viscous mouth solution 15 mL (15 mLs Mouth/Throat Given 01/17/22 0302)  famotidine (PEPCID) IVPB 20 mg premix (0 mg Intravenous Stopped 01/17/22 0508)  HYDROmorphone (DILAUDID) injection 0.5 mg (0.5 mg Intravenous Given 01/17/22 0437)  ondansetron (ZOFRAN) injection 4 mg (4 mg Intravenous Given 01/17/22 0437)  HYDROmorphone (DILAUDID) injection 1 mg (1 mg Intravenous Given 01/17/22 0506)  ondansetron (ZOFRAN) injection 4 mg (4 mg Intravenous Given 01/17/22 0506)  oxyCODONE-acetaminophen (PERCOCET/ROXICET) 5-325 MG per tablet 2 tablet (2 tablets Oral Given 01/17/22 0606)     IMPRESSION / MDM / ASSESSMENT AND PLAN / ED COURSE  I reviewed the triage vital signs and the nursing notes.                             51 year old male presenting with epigastric pain; history of GERD. Differential diagnosis includes, but is not limited to, biliary disease (biliary colic, acute cholecystitis, cholangitis, choledocholithiasis, etc), intrathoracic causes for epigastric abdominal pain including  ACS, gastritis, duodenitis, pancreatitis, small bowel or large bowel obstruction, abdominal aortic aneurysm, hernia, and ulcer(s).  I have personally reviewed patient's records and note his GI office visit from 07/02/2021.  I see that he was scheduled for EGD/colonoscopy but am unable to see the results.  Patient reports he did have these procedures done and was told they were normal.  Patient's presentation is most consistent with acute presentation with potential threat to life or bodily function.  The patient is on the cardiac monitor to evaluate for evidence of arrhythmia and/or significant heart rate changes.  Patient continues to complain of severe epigastric pain despite GI cocktail administered at triage.  Acquiesced to lab work and IV.  Will obtain right upper quadrant abdominal ultrasound.  Administer IV Pepcid and Dilaudid for pain.  Will reassess.  Clinical Course as of 01/17/22 5188  Sat Jan 17, 2022  4166 Transient relief of symptoms after IV Dilaudid but pain now returning.  Will redose. [JS]  0510 Pain down to 3/10 after IV Dilaudid.  Laboratory results demonstrate mild leukocytosis WBC 12.2, mild AKI, T. bili stable from prior.  Awaiting ultrasound results. [JS]  0608 Updated patient on ultrasound results demonstrating gallbladder sludge.  Will refer to general surgery for further work-up with possible HIDA scan.  We will add Carafate and Pepcid to patient's GERD regimen of Protonix.  Will prescribe as needed Percocet and Zofran to use as needed for more severe pain and nausea.  Strict return precautions given.  Patient verbalizes understanding and agrees with plan of care. [JS]    Clinical Course User Index [JS] Paulette Blanch, MD     FINAL CLINICAL IMPRESSION(S) / ED DIAGNOSES   Final diagnoses:  Epigastric pain  Gastroesophageal reflux disease, unspecified whether esophagitis present     Rx / DC Orders   ED Discharge Orders          Ordered    sucralfate (CARAFATE) 1  GM/10ML suspension  4 times daily        01/17/22 0604    famotidine (PEPCID) 20 MG tablet  2 times daily        01/17/22 0604  oxyCODONE-acetaminophen (PERCOCET/ROXICET) 5-325 MG tablet  Every 4 hours PRN        01/17/22 0604    ondansetron (ZOFRAN-ODT) 4 MG disintegrating tablet  Every 8 hours PRN        01/17/22 0604             Note:  This document was prepared using Dragon voice recognition software and may include unintentional dictation errors.   Irean Hong, MD 01/17/22 [phone]

## 2022-01-17 NOTE — ED Triage Notes (Signed)
Pt presents to ER with epigastric pain. Pt reports he usually gets this pain and after a GI cocktail he feels better. Pt denies any nausea or vomiting. Pt talks in complete sentences no respiratory distress noted

## 2022-01-17 NOTE — Discharge Instructions (Addendum)
Continue Protonix daily as directed by your doctor.  Add Carafate 3 times daily with meals and at bedtime.  Add Pepcid 20 mg twice daily.  You may take Percocet and Zofran as needed for more severe pain and nausea.  Eat a bland diet until seen by your doctor.  Return to the ER for worsening symptoms, persistent vomiting, difficulty breathing or other concerns.

## 2022-01-30 ENCOUNTER — Ambulatory Visit: Payer: 59 | Admitting: Surgery

## 2022-04-07 ENCOUNTER — Emergency Department
Admission: EM | Admit: 2022-04-07 | Discharge: 2022-04-07 | Disposition: A | Discharge status: Home / Self Care | Payer: 59 | Attending: Emergency Medicine | Admitting: Emergency Medicine

## 2022-04-07 ENCOUNTER — Other Ambulatory Visit: Payer: Self-pay

## 2022-04-07 DIAGNOSIS — I10 Essential (primary) hypertension: Secondary | ICD-10-CM | POA: Diagnosis not present

## 2022-04-07 DIAGNOSIS — R1084 Generalized abdominal pain: Secondary | ICD-10-CM | POA: Diagnosis not present

## 2022-04-07 DIAGNOSIS — D72829 Elevated white blood cell count, unspecified: Secondary | ICD-10-CM | POA: Insufficient documentation

## 2022-04-07 DIAGNOSIS — R1013 Epigastric pain: Secondary | ICD-10-CM | POA: Diagnosis present

## 2022-04-07 LAB — URINALYSIS, ROUTINE W REFLEX MICROSCOPIC
Bilirubin Urine: NEGATIVE
Glucose, UA: NEGATIVE mg/dL
Hgb urine dipstick: NEGATIVE
Ketones, ur: 5 mg/dL — AB
Nitrite: NEGATIVE
Protein, ur: 30 mg/dL — AB
Specific Gravity, Urine: 1.03 (ref 1.005–1.030)
Squamous Epithelial / HPF: NONE SEEN (ref 0–5)
pH: 5 (ref 5.0–8.0)

## 2022-04-07 LAB — CBC
HCT: 52.3 % — ABNORMAL HIGH (ref 39.0–52.0)
Hemoglobin: 17.4 g/dL — ABNORMAL HIGH (ref 13.0–17.0)
MCH: 27.4 pg (ref 26.0–34.0)
MCHC: 33.3 g/dL (ref 30.0–36.0)
MCV: 82.4 fL (ref 80.0–100.0)
Platelets: 307 10*3/uL (ref 150–400)
RBC: 6.35 MIL/uL — ABNORMAL HIGH (ref 4.22–5.81)
RDW: 12.9 % (ref 11.5–15.5)
WBC: 10.9 10*3/uL — ABNORMAL HIGH (ref 4.0–10.5)
nRBC: 0 % (ref 0.0–0.2)

## 2022-04-07 LAB — LIPASE, BLOOD: Lipase: 34 U/L (ref 11–51)

## 2022-04-07 LAB — COMPREHENSIVE METABOLIC PANEL
ALT: 21 U/L (ref 0–44)
AST: 25 U/L (ref 15–41)
Albumin: 5 g/dL (ref 3.5–5.0)
Alkaline Phosphatase: 58 U/L (ref 38–126)
Anion gap: 11 (ref 5–15)
BUN: 12 mg/dL (ref 6–20)
CO2: 27 mmol/L (ref 22–32)
Calcium: 9.7 mg/dL (ref 8.9–10.3)
Chloride: 98 mmol/L (ref 98–111)
Creatinine, Ser: 1.28 mg/dL — ABNORMAL HIGH (ref 0.61–1.24)
GFR, Estimated: >60 mL/min (ref >60)
Glucose, Bld: 141 mg/dL — ABNORMAL HIGH (ref 70–99)
Potassium: 3.3 mmol/L — ABNORMAL LOW (ref 3.5–5.1)
Sodium: 136 mmol/L (ref 135–145)
Total Bilirubin: 1.7 mg/dL — ABNORMAL HIGH (ref 0.3–1.2)
Total Protein: 8.4 g/dL — ABNORMAL HIGH (ref 6.5–8.1)

## 2022-04-07 MED ORDER — DROPERIDOL 2.5 MG/ML IJ SOLN
2.5000 mg | Freq: Once | INTRAMUSCULAR | Status: AC
  Administered 2022-04-07: 2.5 mg via INTRAVENOUS
  Filled 2022-04-07: qty 2

## 2022-04-07 MED ORDER — ALUM & MAG HYDROXIDE-SIMETH 200-200-20 MG/5ML PO SUSP
30.0000 mL | Freq: Once | ORAL | Status: AC
  Administered 2022-04-07: 30 mL via ORAL
  Filled 2022-04-07: qty 30

## 2022-04-07 MED ORDER — ONDANSETRON 4 MG PO TBDP
4.0000 mg | ORAL_TABLET | Freq: Once | ORAL | Status: AC
  Administered 2022-04-07: 4 mg via ORAL
  Filled 2022-04-07: qty 1

## 2022-04-07 MED ORDER — HYDROMORPHONE HCL 1 MG/ML IJ SOLN
1.0000 mg | Freq: Once | INTRAMUSCULAR | Status: AC
  Administered 2022-04-07: 1 mg via INTRAVENOUS
  Filled 2022-04-07: qty 1

## 2022-04-07 MED ORDER — OXYCODONE-ACETAMINOPHEN 5-325 MG PO TABS
1.0000 | ORAL_TABLET | Freq: Once | ORAL | Status: AC
  Administered 2022-04-07: 1 via ORAL
  Filled 2022-04-07: qty 1

## 2022-04-07 NOTE — Discharge Instructions (Signed)
Your blood work was reassuring.  Please return to the emergency department if your pain starts to migrate to the right lower quadrant or is changing and different from your normal pain.  Otherwise please follow-up with your gastroenterologist.

## 2022-04-07 NOTE — ED Provider Notes (Signed)
[facility] Provider Note    Event Date/Time   First MD Initiated Contact with Patient 04/07/22 1143     (approximate)   History   Abdominal Pain (Pt. To ED POV for epigastric pain since 0200. Pt. States he has been seen several times for same, with no diagnosis. Pt. Denies N/V/D. Pt. States he has tried visc. Lidocaine, protonix, pepcid, tramadol at home with no alleviation of symptoms. Pt. States pain comes in waves.)   HPI  Justin Flowers is a 51 y.o. male who presents with abdominal pain.  Patient tells me he has had multiple episodes of similar pain in the past.  Over the last 2 weeks he has felt the episode coming on.  He endorses feeling a burning sensation throughout the entire abdomen in the middle of the night and decreased appetite.  However he is only had significant pain over the last 3 to 4 days.  Pain is sharp and involves the entire abdomen it is constant but has waves of worsening.  It is associated with decreased appetite but did not necessarily get worse after eating has not had vomiting or nausea no diarrhea having bowel movement still.  Has not noticed any clear exacerbating alleviating factors.  Says that normally when he feels an episode comes on he will take GI cocktail and Protonix at home which he has been doing.  This does feel similar to prior episodes.  He had an ultrasound of the gallbladder done 2 months ago and ED visit that showed sludge but no evidence of cholecystitis or stones.  Last CT abdomen pelvis was from August of last year did not show any acute pathology.  He has since seen GI and had colonoscopy and endoscopy while cannot seen these results says that he was diagnosed with H. pylori and has been treated for it but has not yet taken a test to confirm eradication.     Past Medical History:  Diagnosis Date   Hypertension     There are no problems to display for this patient.    Physical Exam  Triage Vital  Signs: ED Triage Vitals  Enc Vitals Group     BP 04/07/22 0922 134/89     Pulse Rate 04/07/22 0922 (!) 104     Resp 04/07/22 0922 (!) 22     Temp 04/07/22 0933 98.7 F (37.1 C)     Temp Source 04/07/22 0922 Oral     SpO2 04/07/22 0922 96 %     Weight 04/07/22 0924 205 lb (93 kg)     Height 04/07/22 0924 6' (1.829 m)     Head Circumference --      Peak Flow --      Pain Score 04/07/22 0923 9     Pain Loc --      Pain Edu? --      Excl. in GC? --     Most recent vital signs: Vitals:   04/07/22 0922 04/07/22 0933  BP: 134/89   Pulse: (!) 104   Resp: (!) 22   Temp:  98.7 F (37.1 C)  SpO2: 96%      General: Awake, no distress.  CV:  Good peripheral perfusion.  Resp:  Normal effort.  Abd:  No distention.  Abdomen is soft, there is mild tenderness palpation of right lower quadrant suprapubic region left lower quadrant and left upper quadrant no guarding Neuro:  Awake, Alert, Oriented x 3  Other:     ED Results / Procedures / Treatments  Labs (all labs ordered are listed, but only abnormal results are displayed) Labs Reviewed  COMPREHENSIVE METABOLIC PANEL - Abnormal; Notable for the following components:      Result Value   Potassium 3.3 (*)    Glucose, Bld 141 (*)    Creatinine, Ser 1.28 (*)    Total Protein 8.4 (*)    Total Bilirubin 1.7 (*)    All other components within normal limits  CBC - Abnormal; Notable for the following components:   WBC 10.9 (*)    RBC 6.35 (*)    Hemoglobin 17.4 (*)    HCT 52.3 (*)    All other components within normal limits  URINALYSIS, ROUTINE W REFLEX MICROSCOPIC - Abnormal; Notable for the following components:   Color, Urine AMBER (*)    APPearance HAZY (*)    Ketones, ur 5 (*)    Protein, ur 30 (*)    Leukocytes,Ua SMALL (*)    Bacteria, UA FEW (*)    All other components within normal limits  LIPASE, BLOOD     EKG  EKG interpretation performed by myself: NSR, nml axis, nml intervals, no acute ischemic  changes    RADIOLOGY    PROCEDURES:  Critical Care performed: No  Procedures   MEDICATIONS ORDERED IN ED: Medications  alum & mag hydroxide-simeth (MAALOX/MYLANTA) 200-200-20 MG/5ML suspension 30 mL (30 mLs Oral Given 04/07/22 1157)  oxyCODONE-acetaminophen (PERCOCET/ROXICET) 5-325 MG per tablet 1 tablet (1 tablet Oral Given 04/07/22 1156)  ondansetron (ZOFRAN-ODT) disintegrating tablet 4 mg (4 mg Oral Given 04/07/22 1157)  droperidol (INAPSINE) 2.5 MG/ML injection 2.5 mg (2.5 mg Intravenous Given 04/07/22 1239)  HYDROmorphone (DILAUDID) injection 1 mg (1 mg Intravenous Given 04/07/22 1347)     IMPRESSION / MDM / ASSESSMENT AND PLAN / ED COURSE  I reviewed the triage vital signs and the nursing notes.                              Patient's presentation is most consistent with acute complicated illness / injury requiring diagnostic workup.  Differential diagnosis includes, but is not limited to, peptic ulcer, gastritis, pancreatitis, biliary colic, cholecystitis, appendicitis, diverticulitis, IBD, IBS, lactose intolerance, celiac  Patient is a 51 year old male who presents with recurrent abdominal pain.  This has been an ongoing issue for him he has had multiple ED visits for this in the past.  Describes pain is rather diffuse and is associate with anorexia but no vomiting diarrhea fever or chills.  Has had prodrome of abdominal burning overnight last week but this real pain has been going on for about 4 days.  He has seen GI for recently and had endoscopy and colonoscopy that were apparently reassuring other than diagnosis of H. pylori.  She is mildly tachycardic on arrival.  He does look uncomfortable but abdominal exam overall is reassuring he has mild tenderness throughout but does not localize to either right lower or right upper quadrant and abdomen is soft no peritoneal findings.  Labs show mild leukocytosis to 11 potassium 3.3 LFTs and lipase normal.  UA does not suggest  infection.  Patient given Maalox Zofran and a Percocet from triage.  He has a pain at a 4 out of 10 when I am evaluating him after these medications.  Ultimately given this does feel similar to prior episodes and patient has  had cross-sectional imaging in the past I feel that will be low yield to repeat imaging today.  Will attempt to get patient symptom under control.  Will give IV droperidol.    Is resting after droperidol appears comfortable tells me abdominal pain does not improve much.  Said given a milligram of Dilaudid after which says pain is significantly improved.  He would like to go home.  On repeat abdominal exam continues to have no guarding does have mild tenderness in bilateral lower quadrants continues to tell me that pain is same as prior episodes so think we can safely avoid CT.  Patient tolerated p.o. challenge.  Discussed return precautions.   FINAL CLINICAL IMPRESSION(S) / ED DIAGNOSES   Final diagnoses:  Generalized abdominal pain     Rx / DC Orders   ED Discharge Orders     None        Note:  This document was prepared using Dragon voice recognition software and may include unintentional dictation errors.   Georga Hacking, MD 04/07/22 1425

## 2022-04-07 NOTE — ED Provider Triage Note (Signed)
  Emergency Medicine Provider Triage Evaluation Note  Justin Flowers , a 51 y.o.male,  was evaluated in triage.  Pt complains of epigastric abdominal pain.  Reports a history of GERD and H. pylori.  He states that he gets flares frequently.  Has had endoscopic's and CT scans prior which have been unremarkable.  He states that he has tried lidocaine, Protonix, Pepcid, tramadol at home with no alleviation in his symptoms.  His symptoms come in waves.  Additionally reports frequent dry heaving.  Last time he was here he responded well to GI cocktail and oxycodone.   Review of Systems  Positive: Epigastric pain. Negative: Denies fever, chest pain, vomiting  Physical Exam   Vitals:   04/07/22 0922 04/07/22 0933  BP: 134/89   Pulse: (!) 104   Resp: (!) 22   Temp:  98.7 F (37.1 C)  SpO2: 96%    Gen:   Awake, appears uncomfortable and in pain. Resp:  Normal effort  MSK:   Moves extremities without difficulty  Other:    Medical Decision Making  Given the patient's initial medical screening exam, the following diagnostic evaluation has been ordered. The patient will be placed in the appropriate treatment space, once one is available, to complete the evaluation and treatment. I have discussed the plan of care with the patient and I have advised the patient that an ED physician or mid-level practitioner will reevaluate their condition after the test results have been received, as the results may give them additional insight into the type of treatment they may need.    Diagnostics: Labs, urinalysis  Treatments: GI cocktail, Percocet, ondansetron.   Varney Daily, [place] 04/07/22 1031

## 2022-04-07 NOTE — ED Triage Notes (Signed)
Pt. To ED POV for epigastric pain since 0200. Pt. States he has been seen several times for same, with no diagnosis. Pt. Denies N/V/D. Pt. States he has tried visc. Lidocaine, protonix, pepcid, tramadol at home with no alleviation of symptoms. Pt. States pain comes in waves.

## 2022-05-24 ENCOUNTER — Emergency Department: Payer: 59

## 2022-05-24 ENCOUNTER — Emergency Department
Admission: EM | Admit: 2022-05-24 | Discharge: 2022-05-25 | Disposition: A | Discharge status: Home / Self Care | Payer: 59 | Attending: Emergency Medicine | Admitting: Emergency Medicine

## 2022-05-24 ENCOUNTER — Other Ambulatory Visit: Payer: Self-pay

## 2022-05-24 DIAGNOSIS — R1013 Epigastric pain: Secondary | ICD-10-CM | POA: Diagnosis not present

## 2022-05-24 DIAGNOSIS — I10 Essential (primary) hypertension: Secondary | ICD-10-CM | POA: Diagnosis not present

## 2022-05-24 DIAGNOSIS — R1010 Upper abdominal pain, unspecified: Secondary | ICD-10-CM | POA: Diagnosis present

## 2022-05-24 DIAGNOSIS — R000 Tachycardia, unspecified: Secondary | ICD-10-CM | POA: Insufficient documentation

## 2022-05-24 DIAGNOSIS — R110 Nausea: Secondary | ICD-10-CM | POA: Diagnosis not present

## 2022-05-24 MED ORDER — HYDROMORPHONE HCL 1 MG/ML IJ SOLN
1.5000 mg | INTRAMUSCULAR | Status: AC
  Administered 2022-05-24: 1.5 mg via INTRAVENOUS
  Filled 2022-05-24: qty 1.5

## 2022-05-24 MED ORDER — SODIUM CHLORIDE 0.9 % IV BOLUS
1000.0000 mL | Freq: Once | INTRAVENOUS | Status: AC
  Administered 2022-05-24: 1000 mL via INTRAVENOUS

## 2022-05-24 MED ORDER — HYDROMORPHONE HCL 1 MG/ML IJ SOLN
0.5000 mg | INTRAMUSCULAR | Status: AC
  Administered 2022-05-24: 0.5 mg via INTRAVENOUS
  Filled 2022-05-24: qty 0.5

## 2022-05-24 MED ORDER — DICYCLOMINE HCL 10 MG PO CAPS
10.0000 mg | ORAL_CAPSULE | Freq: Once | ORAL | Status: AC
  Administered 2022-05-24: 10 mg via ORAL
  Filled 2022-05-24: qty 1

## 2022-05-24 MED ORDER — HYDROMORPHONE HCL 1 MG/ML IJ SOLN: 1.0000 mg | INTRAMUSCULAR | Status: DC

## 2022-05-24 NOTE — ED Provider Notes (Signed)
[facility] Provider Note   Event Date/Time   First MD Initiated Contact with Patient 05/24/22 2117     (approximate)  History   Abdominal Pain  HPI  Beorn Portman is a 52 y.o. male here for evaluation of recurrent severe episodes of what he describes as a crampy pain in his upper mid abdomen  Patient reports that very frequently roughly monthly he will have severe episodes of what feels like a cramping spasm-like pain in the middle of his upper abdomen.  Sometimes accompanied by nausea.  Reports is severe, difficult to alleviate sometimes will improve after he uses lidocaine Maalox and nausea medication his house   Patient with previous evaluations by GI without clear causation other than diagnosis of Helicobacter  No chest pain or shortness of breath no fevers.  No diarrhea.  Physical Exam   Triage Vital Signs: ED Triage Vitals  Enc Vitals Group     BP 05/24/22 2019 (!) 174/103     Pulse Rate 05/24/22 2019 (!) 130     Resp 05/24/22 2019 16     Temp 05/24/22 2019 98 F (36.7 C)     Temp Source 05/24/22 2019 Oral     SpO2 05/24/22 2019 97 %     Weight 05/24/22 2020 205 lb 0.4 oz (93 kg)     Height 05/24/22 2020 6' (1.829 m)     Head Circumference --      Peak Flow --      Pain Score 05/24/22 2020 10     Pain Loc --      Pain Edu? --      Excl. in [place]? --     Most recent vital signs: Vitals:   05/24/22 2315 05/24/22 2330  BP:    Pulse: (!) 105 96  Resp: (!) 26 13  Temp:    SpO2: 94% 96%     General: Awake, bent over, appears in severe pain reporting pain in his mid upper abdomen CV:  Good peripheral perfusion.  Mild tachycardia sinus tachycardia heart rate approximately 1 10-1 20.  Normal rate and rhythm out murmur Resp:  Normal effort.  Clear bilateral Abd:  No distention.  Soft and nondistended, but reports moderate tenderness in the epigastrium.  No rebound or guarding.  No lower abdominal pain.  No rebound or  guarding Other:     ED Results / Procedures / Treatments   Labs (all labs ordered are listed, but only abnormal results are displayed) Labs Reviewed  CBC  LIPASE, BLOOD  COMPREHENSIVE METABOLIC PANEL     EKG  Interpreted by me at 2235 heart rate 98 QRS 110 QTc 460 Normal sinus rhythm no evidence of acute ischemia   RADIOLOGY  Right upper quadrant ultrasound negative for acute pathology.  Fatty liver disease  CT abdomen pelvis pending at time of transfer of care to Dr. Tamala Julian   PROCEDURES:  Critical Care performed: No  Procedures   MEDICATIONS ORDERED IN ED: Medications  sodium chloride 0.9 % bolus 1,000 mL (0 mLs Intravenous Stopped 05/24/22 2325)  dicyclomine (BENTYL) capsule 10 mg (10 mg Oral Given 05/24/22 2221)  HYDROmorphone (DILAUDID) injection 1.5 mg (1.5 mg Intravenous Given 05/24/22 2221)  HYDROmorphone (DILAUDID) injection 0.5 mg (0.5 mg Intravenous Given 05/24/22 2332)     IMPRESSION / MDM / ASSESSMENT AND PLAN / ED COURSE  I reviewed the triage vital signs and the nursing notes.  Differential diagnosis includes, but is not limited to, recurrent or chronic recurrent upper epigastric abdominal pain.  Differential diagnosis is somewhat broad but seems to be likely GI related EKG without evidence of ischemia he denies any chest pain or shortness of breath.  He is notably hypertensive and somewhat tachycardic on arrival but he also appears to be in fairly severe pain which I suspect is the underlying causation.  He has had a complex medical workups in the past without clear causation, he is called the GI clinic and is planned to follow-up this week with them.  Patient initially declining to have labs checked as he reports such a recurrent history and previous workups have all been unrevealing.  However after discussing the severity of symptoms and pain patient improves's proceeding with obtaining labs and a repeat CT and ultrasound.  At  this juncture given the severity of pain differential diagnosis being quite broad, but etiologies such as gastritis, peptic ulcer, inflammatory bowel, esophagitis and particularly with his symptoms tonight esophageal spasm seem on his differential.  No clear acute vascular symptoms with this kind of recurrent nature, no cardiopulmonary symptoms  Patient's presentation is most consistent with acute complicated illness / injury requiring diagnostic workup.   The patient is on the cardiac monitor to evaluate for evidence of arrhythmia and/or significant heart rate changes.  1130p  Patient reports pain improving resting more comfortably.  Awaiting labs and CT imaging.  Ongoing care assigned to Dr. Tamala Julian follow-up on CBC comprehensive metabolic panel, lipase CT and reassessment.  Seems to be a rather recurrent issue, if pain controlled and workup reassuring anticipate discharge with planned GI follow-up which the patient is already started to arrange.  She is not driving himself home, he advised his wife will be driving him not to drive this morning     FINAL CLINICAL IMPRESSION(S) / ED DIAGNOSES   Final diagnoses:  Epigastric abdominal pain     Rx / DC Orders   ED Discharge Orders     None        Note:  This document was prepared using Dragon voice recognition software and may include unintentional dictation errors.   Delman Kitten, MD 05/24/22 [phone]

## 2022-05-24 NOTE — Discharge Instructions (Signed)
No driving this morning.   Please return to the emergency room right away if you are to develop a fever, severe nausea, your pain becomes severe or worsens, you are unable to keep food down, begin vomiting any dark or bloody fluid, you develop any dark or bloody stools, feel dehydrated, or other new concerns or symptoms arise.

## 2022-05-24 NOTE — ED Triage Notes (Signed)
Pt to Ed from home for abdominal pain. Pt immediately states "please dont EKG or lab me as it costs money". Pt advised this is chronic and MD Beather Arbour is familiar with him. He just had an endoscopy as well with no answers. Pt appears to be uncomfortable and bent over in pain. He called GI person and they scheduled another follow up apt.   Pt has taken all OTC meds and his prescribed GI meds with no relief.

## 2022-05-25 ENCOUNTER — Emergency Department: Payer: 59

## 2022-05-25 LAB — CBC
HCT: 43.4 % (ref 39.0–52.0)
Hemoglobin: 14.8 g/dL (ref 13.0–17.0)
MCH: 28.2 pg (ref 26.0–34.0)
MCHC: 34.1 g/dL (ref 30.0–36.0)
MCV: 82.8 fL (ref 80.0–100.0)
Platelets: 189 10*3/uL (ref 150–400)
RBC: 5.24 MIL/uL (ref 4.22–5.81)
RDW: 13.6 % (ref 11.5–15.5)
WBC: 6.1 10*3/uL (ref 4.0–10.5)
nRBC: 0 % (ref 0.0–0.2)

## 2022-05-25 LAB — COMPREHENSIVE METABOLIC PANEL
ALT: 21 U/L (ref 0–44)
AST: 20 U/L (ref 15–41)
Albumin: 4.3 g/dL (ref 3.5–5.0)
Alkaline Phosphatase: 46 U/L (ref 38–126)
Anion gap: 9 (ref 5–15)
BUN: 11 mg/dL (ref 6–20)
CO2: 24 mmol/L (ref 22–32)
Calcium: 8.7 mg/dL — ABNORMAL LOW (ref 8.9–10.3)
Chloride: 104 mmol/L (ref 98–111)
Creatinine, Ser: 1.02 mg/dL (ref 0.61–1.24)
GFR, Estimated: >60 mL/min (ref >60)
Glucose, Bld: 101 mg/dL — ABNORMAL HIGH (ref 70–99)
Potassium: 3.3 mmol/L — ABNORMAL LOW (ref 3.5–5.1)
Sodium: 137 mmol/L (ref 135–145)
Total Bilirubin: 1.7 mg/dL — ABNORMAL HIGH (ref 0.3–1.2)
Total Protein: 6.7 g/dL (ref 6.5–8.1)

## 2022-05-25 LAB — LIPASE, BLOOD: Lipase: 40 U/L (ref 11–51)

## 2022-05-25 MED ORDER — KETOROLAC TROMETHAMINE 30 MG/ML IJ SOLN
15.0000 mg | Freq: Once | INTRAMUSCULAR | Status: AC
  Administered 2022-05-25: 15 mg via INTRAVENOUS
  Filled 2022-05-25: qty 1

## 2022-05-25 MED ORDER — OXYCODONE HCL 5 MG PO TABS: 5.0000 mg | ORAL_TABLET | Freq: Three times a day (TID) | ORAL | 0 refills | Status: DC | PRN

## 2022-05-25 MED ORDER — IOHEXOL 300 MG/ML  SOLN
100.0000 mL | Freq: Once | INTRAMUSCULAR | Status: AC | PRN
  Administered 2022-05-25: 100 mL via INTRAVENOUS

## 2022-05-25 NOTE — ED Provider Notes (Signed)
Patient received in signout from Dr. Jacqualine Code pending blood work and CT scan for evaluation of another recurrence of epigastric pain.  Blood work is generally reassuring with no leukocytosis.  Minimal hypokalemia is noted.  Normal lipase.  CT without evidence of acute intra-abdominal pathology.  He reports pain is improved.  Will add Toradol and he can follow-up with his GI provider.  We discussed return precautions.  He is suitable for outpatient management.   Justin Crofts, MD 05/25/22 0111

## 2022-06-20 ENCOUNTER — Other Ambulatory Visit: Payer: Self-pay

## 2022-06-20 ENCOUNTER — Emergency Department: Payer: 59

## 2022-06-20 ENCOUNTER — Emergency Department
Admission: EM | Admit: 2022-06-20 | Discharge: 2022-06-20 | Disposition: A | Discharge status: Home / Self Care | Payer: 59 | Attending: Emergency Medicine | Admitting: Emergency Medicine

## 2022-06-20 DIAGNOSIS — R1013 Epigastric pain: Secondary | ICD-10-CM | POA: Diagnosis present

## 2022-06-20 LAB — COMPREHENSIVE METABOLIC PANEL
ALT: 21 U/L (ref 0–44)
AST: 21 U/L (ref 15–41)
Albumin: 4.5 g/dL (ref 3.5–5.0)
Alkaline Phosphatase: 60 U/L (ref 38–126)
Anion gap: 10 (ref 5–15)
BUN: 12 mg/dL (ref 6–20)
CO2: 28 mmol/L (ref 22–32)
Calcium: 9.4 mg/dL (ref 8.9–10.3)
Chloride: 99 mmol/L (ref 98–111)
Creatinine, Ser: 1.3 mg/dL — ABNORMAL HIGH (ref 0.61–1.24)
GFR, Estimated: >60 mL/min (ref >60)
Glucose, Bld: 125 mg/dL — ABNORMAL HIGH (ref 70–99)
Potassium: 3.6 mmol/L (ref 3.5–5.1)
Sodium: 137 mmol/L (ref 135–145)
Total Bilirubin: 1 mg/dL (ref 0.3–1.2)
Total Protein: 7.9 g/dL (ref 6.5–8.1)

## 2022-06-20 LAB — CBC WITH DIFFERENTIAL/PLATELET
Abs Immature Granulocytes: 0.03 10*3/uL (ref 0.00–0.07)
Basophils Absolute: 0.1 10*3/uL (ref 0.0–0.1)
Basophils Relative: 1 %
Eosinophils Absolute: 0.5 10*3/uL (ref 0.0–0.5)
Eosinophils Relative: 4 %
HCT: 48.2 % (ref 39.0–52.0)
Hemoglobin: 16 g/dL (ref 13.0–17.0)
Immature Granulocytes: 0 %
Lymphocytes Relative: 29 %
Lymphs Abs: 3 10*3/uL (ref 0.7–4.0)
MCH: 27.9 pg (ref 26.0–34.0)
MCHC: 33.2 g/dL (ref 30.0–36.0)
MCV: 84 fL (ref 80.0–100.0)
Monocytes Absolute: 0.8 10*3/uL (ref 0.1–1.0)
Monocytes Relative: 8 %
Neutro Abs: 6.1 10*3/uL (ref 1.7–7.7)
Neutrophils Relative %: 58 %
Platelets: 268 10*3/uL (ref 150–400)
RBC: 5.74 MIL/uL (ref 4.22–5.81)
RDW: 14 % (ref 11.5–15.5)
WBC: 10.5 10*3/uL (ref 4.0–10.5)
nRBC: 0 % (ref 0.0–0.2)

## 2022-06-20 LAB — TROPONIN I (HIGH SENSITIVITY)
Troponin I (High Sensitivity): 3 ng/L (ref <18)
Troponin I (High Sensitivity): 2 ng/L (ref <18)

## 2022-06-20 LAB — LIPASE, BLOOD: Lipase: 37 U/L (ref 11–51)

## 2022-06-20 MED ORDER — SUCRALFATE 1 G PO TABS
1.0000 g | ORAL_TABLET | Freq: Once | ORAL | Status: AC
  Administered 2022-06-20: 1 g via ORAL
  Filled 2022-06-20: qty 1

## 2022-06-20 MED ORDER — OXYCODONE HCL 5 MG PO TABS: 5.0000 mg | ORAL_TABLET | Freq: Three times a day (TID) | ORAL | 0 refills | Status: DC | PRN

## 2022-06-20 MED ORDER — KETOROLAC TROMETHAMINE 30 MG/ML IJ SOLN
30.0000 mg | Freq: Once | INTRAMUSCULAR | Status: AC
  Administered 2022-06-20: 30 mg via INTRAMUSCULAR
  Filled 2022-06-20: qty 1

## 2022-06-20 MED ORDER — PANTOPRAZOLE SODIUM 40 MG IV SOLR
40.0000 mg | Freq: Once | INTRAVENOUS | Status: AC
  Administered 2022-06-20: 40 mg via INTRAVENOUS
  Filled 2022-06-20: qty 10

## 2022-06-20 MED ORDER — IOHEXOL 350 MG/ML SOLN
100.0000 mL | Freq: Once | INTRAVENOUS | Status: AC | PRN
  Administered 2022-06-20: 100 mL via INTRAVENOUS

## 2022-06-20 MED ORDER — HYDROMORPHONE HCL 1 MG/ML IJ SOLN
1.0000 mg | Freq: Once | INTRAMUSCULAR | Status: AC
  Administered 2022-06-20: 1 mg via INTRAVENOUS
  Filled 2022-06-20: qty 1

## 2022-06-20 MED ORDER — ONDANSETRON 4 MG PO TBDP
4.0000 mg | ORAL_TABLET | Freq: Once | ORAL | Status: AC
  Administered 2022-06-20: 4 mg via ORAL
  Filled 2022-06-20: qty 1

## 2022-06-20 MED ORDER — DICYCLOMINE HCL 10 MG PO CAPS
10.0000 mg | ORAL_CAPSULE | Freq: Once | ORAL | Status: AC
  Administered 2022-06-20: 10 mg via ORAL
  Filled 2022-06-20: qty 1

## 2022-06-20 MED ORDER — FAMOTIDINE IN NACL 20-0.9 MG/50ML-% IV SOLN
20.0000 mg | Freq: Once | INTRAVENOUS | Status: AC
  Administered 2022-06-20: 20 mg via INTRAVENOUS
  Filled 2022-06-20: qty 50

## 2022-06-20 MED ORDER — ALUM & MAG HYDROXIDE-SIMETH 200-200-20 MG/5ML PO SUSP
30.0000 mL | Freq: Once | ORAL | Status: AC
  Administered 2022-06-20: 30 mL via ORAL
  Filled 2022-06-20: qty 30

## 2022-06-20 MED ORDER — FENTANYL CITRATE PF 50 MCG/ML IJ SOSY
50.0000 ug | PREFILLED_SYRINGE | Freq: Once | INTRAMUSCULAR | Status: AC
  Administered 2022-06-20: 50 ug via INTRAVENOUS
  Filled 2022-06-20: qty 1

## 2022-06-20 MED ORDER — LIDOCAINE VISCOUS HCL 2 % MT SOLN
15.0000 mL | Freq: Once | OROMUCOSAL | Status: AC
  Administered 2022-06-20: 15 mL via OROMUCOSAL
  Filled 2022-06-20: qty 15

## 2022-06-20 NOTE — ED Provider Notes (Signed)
[facility] Provider Note    Event Date/Time   First MD Initiated Contact with Patient 06/20/22 0122     (approximate)   History   Abdominal Pain   HPI  Justin Flowers is a 52 y.o. male who presents to the ED for evaluation of Abdominal Pain   I reviewed a GI clinic visit from 1 year ago as well as multiple ED visits in the past year for epigastric pain.  Prescribed a PPI.   Patient presents to the ED for evaluation of "another episode."  He reports similar epigastric pain as his previous episodes in the past but reports it feels of a slightly more severe intensity tonight and some of the spells in the past.  Symptoms started around 10 or 11 PM and have been more intense over the past 2 hours.  He reports compliance with his PPI and H2 blocker.   No fevers, chest pain, dyspnea, cough, syncope or other recent illnesses.  Physical Exam   Triage Vital Signs: ED Triage Vitals  Enc Vitals Group     BP 06/20/22 0118 (!) 158/105     Pulse Rate 06/20/22 0118 (!) 104     Resp 06/20/22 0118 (!) 24     Temp 06/20/22 0118 97.6 F (36.4 C)     Temp Source 06/20/22 0118 Oral     SpO2 06/20/22 0118 96 %     Weight 06/20/22 0119 190 lb (86.2 kg)     Height --      Head Circumference --      Peak Flow --      Pain Score 06/20/22 0119 10     Pain Loc --      Pain Edu? --      Excl. in [place]? --     Most recent vital signs: Vitals:   06/20/22 0118 06/20/22 0410  BP: (!) 158/105 132/87  Pulse: (!) 104 95  Resp: (!) 24 18  Temp: 97.6 F (36.4 C) (!) 97.4 F (36.3 C)  SpO2: 96% 98%    General: Awake, no distress.  Standing beside a stretcher, hunched over and clearly uncomfortable, moaning. CV:  Good peripheral perfusion.  Resp:  Normal effort.  Abd:  No distention.  MSK:  No deformity noted.  Neuro:  No focal deficits appreciated. Other:     ED Results / Procedures / Treatments   Labs (all labs ordered are listed, but only abnormal  results are displayed) Labs Reviewed  COMPREHENSIVE METABOLIC PANEL - Abnormal; Notable for the following components:      Result Value   Glucose, Bld 125 (*)    Creatinine, Ser 1.30 (*)    All other components within normal limits  CBC WITH DIFFERENTIAL/PLATELET  LIPASE, BLOOD  TROPONIN I (HIGH SENSITIVITY)  TROPONIN I (HIGH SENSITIVITY)    EKG Sinus tach with a rate of 109 bpm.  Normal axis and intervals.  No clear signs of acute ischemia  RADIOLOGY CTA abdomen/pelvis interpreted by me without evidence of acute intra-abdominal pathology  Official radiology report(s): CT Angio Abd/Pel W and/or Wo Contrast  Result Date: 06/20/2022 CLINICAL DATA:  Epigastric pain. Question esophageal spasm versus ischemia. EXAM: CTA ABDOMEN AND PELVIS WITHOUT AND WITH CONTRAST TECHNIQUE: Multidetector CT imaging of the abdomen and pelvis was performed using the standard protocol during bolus administration of intravenous contrast. Multiplanar reconstructed images and MIPs were obtained and reviewed to evaluate the vascular anatomy. RADIATION DOSE REDUCTION: This exam was performed according to  the departmental dose-optimization program which includes automated exposure control, adjustment of the mA and/or kV according to patient size and/or use of iterative reconstruction technique. CONTRAST:  134m OMNIPAQUE IOHEXOL 350 MG/ML SOLN COMPARISON:  CT abdomen and pelvis 05/25/2008 FINDINGS: VASCULAR Aorta: Normal caliber aorta without aneurysm, dissection, vasculitis or significant stenosis. Celiac: Patent without evidence of aneurysm, dissection, vasculitis or significant stenosis. SMA: Patent without evidence of aneurysm, dissection, vasculitis or significant stenosis. Renals: Both renal arteries are patent without evidence of aneurysm, dissection, vasculitis, fibromuscular dysplasia or significant stenosis. IMA: Patent without evidence of aneurysm, dissection, vasculitis or significant stenosis. Inflow: Patent  without evidence of aneurysm, dissection, vasculitis or significant stenosis. Proximal Outflow: Bilateral common femoral and visualized portions of the superficial and profunda femoral arteries are patent without evidence of aneurysm, dissection, vasculitis or significant stenosis. Veins: Patent portal veins. Review of the MIP images confirms the above findings. NON-VASCULAR Lower chest: No acute abnormality. Hepatobiliary: Unremarkable liver, gallbladder, and biliary tree. Pancreas: Unremarkable. Spleen: Unremarkable. Adrenals/Urinary Tract: Normal adrenal glands. No urinary calculi or hydronephrosis. Unremarkable bladder. Stomach/Bowel: Normal caliber large and small bowel. Colonic diverticulosis without diverticulitis. No bowel wall thickening. Normal appendix. Unremarkable stomach. Lymphatic: No suspicious lymphadenopathy. Reproductive: Unremarkable. Other: No free intraperitoneal fluid or air. Musculoskeletal: No acute osseous abnormality. IMPRESSION: 1. No acute vascular abnormality. 2. No CT correlate for epigastric pain. Electronically Signed   By: TPlacido SouM.D.   On: 06/20/2022 03:46    PROCEDURES and INTERVENTIONS:  Procedures  Medications  ondansetron (ZOFRAN-ODT) disintegrating tablet 4 mg (4 mg Oral Given 06/20/22 0143)  alum & mag hydroxide-simeth (MAALOX/MYLANTA) 200-200-20 MG/5ML suspension 30 mL (30 mLs Oral Given 06/20/22 0142)  lidocaine (XYLOCAINE) 2 % viscous mouth solution 15 mL (15 mLs Mouth/Throat Given 06/20/22 0141)  ketorolac (TORADOL) 30 MG/ML injection 30 mg (30 mg Intramuscular Given 06/20/22 0219)  HYDROmorphone (DILAUDID) injection 1 mg (1 mg Intravenous Given 06/20/22 0300)  iohexol (OMNIPAQUE) 350 MG/ML injection 100 mL (100 mLs Intravenous Contrast Given 06/20/22 0325)  sucralfate (CARAFATE) tablet 1 g (1 g Oral Given 06/20/22 0514)  famotidine (PEPCID) IVPB 20 mg premix (0 mg Intravenous Stopped 06/20/22 0600)  pantoprazole (PROTONIX) injection 40 mg (40 mg Intravenous Given  06/20/22 0516)  dicyclomine (BENTYL) capsule 10 mg (10 mg Oral Given 06/20/22 0554)  fentaNYL (SUBLIMAZE) injection 50 mcg (50 mcg Intravenous Given 06/20/22 0554)     IMPRESSION / MDM / ASSESSMENT AND PLAN / ED COURSE  I reviewed the triage vital signs and the nursing notes.  Differential diagnosis includes, but is not limited to, pancreatitis, gastritis, GERD, malingering, esophageal spasm, irritable bowel  {Patient presents with symptoms of an acute illness or injury that is potentially life-threatening.  52year old male again presents with severe acute epigastric pain without evidence of acute pathology and suitable for outpatient management and GI follow-up.  Fourth occurrence in the past 6 months without GI follow-up.  Did add on a CTA abdomen/pelvis today as this has not been done on the previous encounters and without evidence of ischemic features or other intra-abdominal pathology.  Blood work is benign with a normal CBC, metabolic panel, lipase and troponin x 2.  Requires quite a few rounds of medications this time before controlled pain and his request for discharge.  I urged him to actually follow-up with his GI physician and we discussed appropriate return precautions for the ED.  Discharged with prescription for 5 tablets of oxycodone.  Clinical Course as of 06/20/22 0606  Sat Jun 20, 2022  [id number] Despite the Toradol and GI cocktail he is still very uncomfortable without improvement.  We will add IV Dilaudid and order a CTA to ensure no signs of ischemia considering the pain and quite out of proportion from my examination. [DS]    Clinical Course User Index [DS] Vladimir Crofts, MD     FINAL CLINICAL IMPRESSION(S) / ED DIAGNOSES   Final diagnoses:  Epigastric pain     Rx / DC Orders   ED Discharge Orders          Ordered    oxyCODONE (ROXICODONE) 5 MG immediate release tablet  Every 8 hours PRN        06/20/22 0549             Note:  This document was prepared using  Dragon voice recognition software and may include unintentional dictation errors.   Vladimir Crofts, MD 06/20/22 [phone]

## 2022-06-20 NOTE — ED Triage Notes (Signed)
Pt presents to ER with c/o epigastric area abd pain that started around 2000 yesterday and has been getting progressively worse.  Pt states he has been seen here several times for same and has not got any answers.  Pt bent over in pain in triage, unable to sit still.  Pt denies n/v/d.  Pt is otherwise A&O x4.

## 2022-06-20 NOTE — Discharge Instructions (Addendum)
Continue your acid medications, Pepcid, Carafate and Protonix.  Please reach out to the GI doctor to be seen in the clinic for your continued pain

## 2022-06-22 NOTE — H&P (View-Only) (Signed)
Patient ID: Justin Flowers, male   DOB: 11-20-1970, 52 y.o.   MRN: [id number]  Chief Complaint:  Mid-abdominal pain  History of Present Illness Justin Flowers is a 52 y.o. male with a history of intermittent, yet excruciating pain of the upper abdomen, multiple ED visist with associated nausea, and the sense of extreme bloating.  He denies vomiting.  3 yrs ago the first attack began enroute home from the beach.  GI cocktails have been helpful in the past to where the ED would make this available at home.  Last October had sludge on GB  u/s.  Would apply deep pressure to xiphoid to help pain. Fetal positioning, alleviating the anterior abdominal wall tension would be his most tolerable position, to where his back would hurt staying in the position.  GI work-up has been reportedly negative with EGD and colonoscopy revealing nothing.  PPI, pepcid and home GI cocktails no longer provide lasting relief.  Now worse over last 5 days.  Has limited diet to attempt to determine causative factors.    Past Medical History Past Medical History:  Diagnosis Date   Hypertension       Past Surgical History:  Procedure Laterality Date   ANKLE SURGERY      No Known Allergies  Current Outpatient Medications  Medication Sig Dispense Refill   alum & mag hydroxide-simeth (MAALOX MAX) [id number] MG/5ML suspension Take 5 mLs by mouth every 6 (six) hours as needed for indigestion. 355 mL 0   famotidine (PEPCID) 20 MG tablet Take 1 tablet (20 mg total) by mouth 2 (two) times daily. 60 tablet 0   lidocaine (XYLOCAINE) 2 % solution Use as directed 15 mLs in the mouth or throat every 4 (four) hours as needed (For upper abdominal pain). 150 mL 1   lisinopril-hydrochlorothiazide (ZESTORETIC) 20-12.5 MG tablet Take 1 tablet by mouth daily.     ondansetron (ZOFRAN-ODT) 4 MG disintegrating tablet Take 1 tablet (4 mg total) by mouth every 8 (eight) hours as needed for nausea or vomiting. 30 tablet 0    pantoprazole (PROTONIX) 40 MG tablet Take 1 tablet (40 mg total) by mouth daily. 30 tablet 1   pantoprazole (PROTONIX) 40 MG tablet Take 1 tablet (40 mg total) by mouth daily. 30 tablet 1   No current facility-administered medications for this visit.    Family History No family history on file.    Social History Social History   Tobacco Use   Smoking status: Former    Packs/day: 1.00    Years: 6.00    Total pack years: 6.00    Types: Cigarettes    Quit date: 09/13/1995    Years since quitting: 26.7    Passive exposure: Past   Smokeless tobacco: Current  Vaping Use   Vaping Use: Never used  Substance Use Topics   Alcohol use: Not Currently    Alcohol/week: 0.0 standard drinks of alcohol   Drug use: Never        Review of Systems  Constitutional:  Positive for chills and fever.  HENT:  Negative for hearing loss, nosebleeds, sore throat and tinnitus.   Eyes:  Negative for blurred vision, double vision and pain.  Respiratory:  Negative for cough, hemoptysis, sputum production, shortness of breath, wheezing and stridor.   Cardiovascular:  Negative for chest pain, palpitations and leg swelling.  Gastrointestinal:  Positive for abdominal pain and nausea. Negative for blood in stool, constipation, diarrhea, heartburn, melena and vomiting.  Genitourinary:  Negative for dysuria,  flank pain, frequency, hematuria and urgency.  Skin:  Negative for itching and rash.  Psychiatric/Behavioral:  Negative for depression, substance abuse and suicidal ideas.      Physical Exam Blood pressure (!) 132/96, pulse (!) 117, temperature 99.1 F (37.3 C), height '5\' 11"'$  (1.803 m), weight 196 lb (88.9 kg), SpO2 95 %. Last Weight  Most recent update: 06/23/2022  2:47 PM    Weight  88.9 kg (196 lb)             CONSTITUTIONAL: Well developed, and nourished, appropriately responsive and aware without distress.   EYES: Sclera non-icteric.   EARS, NOSE, MOUTH AND THROAT:  The oropharynx is clear.  Oral mucosa is pink and moist.    Hearing is intact to voice.  NECK: Trachea is midline, and there is no jugular venous distension.  LYMPH NODES:  Lymph nodes in the neck are not appreciated. RESPIRATORY:  Lungs are clear, and breath sounds are equal bilaterally.   Normal respiratory effort without pathologic use of accessory muscles. CARDIOVASCULAR: Heart is regular in rhythm.  Rate up 116.  Well perfused.  GI: The abdomen is tenderness in the right upper quadrant and epigastrium.  Otherwise soft, nontender, and nondistended. There were no palpable masses.  I did not appreciate hepatosplenomegaly. There were normal bowel sounds.   MUSCULOSKELETAL:  Symmetrical muscle tone appreciated in all four extremities.    SKIN: Skin turgor is normal. No pathologic skin lesions appreciated.  NEUROLOGIC:  Motor and sensation appear grossly normal.  Cranial nerves are grossly without defect. PSYCH:  Alert and oriented to person, place and time. Affect is appropriate for situation.  Data Reviewed I have personally reviewed what is currently available of the patient's imaging, recent labs and medical records.   Labs:     Latest Ref Rng & Units 06/20/2022    1:37 AM 05/24/2022   11:28 PM 04/07/2022    9:26 AM  CBC  WBC 4.0 - 10.5 K/uL 10.5  6.1  10.9   Hemoglobin 13.0 - 17.0 g/dL 16.0  14.8  17.4   Hematocrit 39.0 - 52.0 % 48.2  43.4  52.3   Platelets 150 - 400 K/uL 268  189  307       Latest Ref Rng & Units 06/20/2022    1:37 AM 05/24/2022   11:28 PM 04/07/2022    9:26 AM  CMP  Glucose 70 - 99 mg/dL 125  101  141   BUN 6 - 20 mg/dL '12  11  12   '$ Creatinine 0.61 - 1.24 mg/dL 1.30  1.02  1.28   Sodium 135 - 145 mmol/L 137  137  136   Potassium 3.5 - 5.1 mmol/L 3.6  3.3  3.3   Chloride 98 - 111 mmol/L 99  104  98   CO2 22 - 32 mmol/L '28  24  27   '$ Calcium 8.9 - 10.3 mg/dL 9.4  8.7  9.7   Total Protein 6.5 - 8.1 g/dL 7.9  6.7  8.4   Total Bilirubin 0.3 - 1.2 mg/dL 1.0  1.7  1.7   Alkaline Phos 38 - 126  U/L 60  46  58   AST 15 - 41 U/L '21  20  25   '$ ALT 0 - 44 U/L '21  21  21     '$ Imaging: Radiological images reviewed:  CLINICAL DATA:  Abdominal pain.   EXAM: ULTRASOUND ABDOMEN LIMITED RIGHT UPPER QUADRANT   COMPARISON:  Ultrasound dated 01/17/2022.  FINDINGS: Gallbladder:   No gallstones or wall thickening visualized. No sonographic Murphy sign noted by sonographer.   Common bile duct:   Diameter: 2 mm   Liver:   There is diffuse increased liver echogenicity most commonly seen in the setting of fatty infiltration. Superimposed inflammation or fibrosis is not excluded. Clinical correlation is recommended. Portal vein is patent on color Doppler imaging with normal direction of blood flow towards the liver.   Other: None.   IMPRESSION: Fatty liver, otherwise unremarkable right upper quadrant ultrasound.     Electronically Signed   By: Anner Crete M.D.   On: 05/24/2022 22:22 CLINICAL DATA:  52 year old male with postprandial upper abdominal pain.   EXAM: ULTRASOUND ABDOMEN LIMITED RIGHT UPPER QUADRANT   COMPARISON:  CT Abdomen and Pelvis 11/10/2020. Abdomen ultrasound 02/06/2019.   FINDINGS: Gallbladder:   Layering sludge within the gallbladder. Gallbladder wall thickness remains within normal limits. No shadowing echogenic stones identified. No pericholecystic fluid. No sonographic Murphy sign elicited.   Common bile duct:   Diameter: 2-3 mm, normal.   Liver:   Chronically echogenic liver (image 27) and some hepatic steatosis demonstrated by CT last year. No discrete liver lesion. No intrahepatic biliary ductal dilatation. Portal vein is patent on color Doppler imaging with normal direction of blood flow towards the liver.   Other: Negative visible right kidney.  No free fluid.   IMPRESSION: 1. Gallbladder sludge but no evidence of stones or acute cholecystitis. 2. Chronic fatty liver disease. No evidence of bile duct obstruction.      Electronically Signed   By: Genevie Ann M.D.   On: 01/17/2022 05:26  CLINICAL DATA:  Epigastric pain. Question esophageal spasm versus ischemia.   EXAM: CTA ABDOMEN AND PELVIS WITHOUT AND WITH CONTRAST   TECHNIQUE: Multidetector CT imaging of the abdomen and pelvis was performed using the standard protocol during bolus administration of intravenous contrast. Multiplanar reconstructed images and MIPs were obtained and reviewed to evaluate the vascular anatomy.   RADIATION DOSE REDUCTION: This exam was performed according to the departmental dose-optimization program which includes automated exposure control, adjustment of the mA and/or kV according to patient size and/or use of iterative reconstruction technique.   CONTRAST:  164m OMNIPAQUE IOHEXOL 350 MG/ML SOLN   COMPARISON:  CT abdomen and pelvis 05/25/2008   FINDINGS: VASCULAR   Aorta: Normal caliber aorta without aneurysm, dissection, vasculitis or significant stenosis.   Celiac: Patent without evidence of aneurysm, dissection, vasculitis or significant stenosis.   SMA: Patent without evidence of aneurysm, dissection, vasculitis or significant stenosis.   Renals: Both renal arteries are patent without evidence of aneurysm, dissection, vasculitis, fibromuscular dysplasia or significant stenosis.   IMA: Patent without evidence of aneurysm, dissection, vasculitis or significant stenosis.   Inflow: Patent without evidence of aneurysm, dissection, vasculitis or significant stenosis.   Proximal Outflow: Bilateral common femoral and visualized portions of the superficial and profunda femoral arteries are patent without evidence of aneurysm, dissection, vasculitis or significant stenosis.   Veins: Patent portal veins.   Review of the MIP images confirms the above findings.   NON-VASCULAR   Lower chest: No acute abnormality.   Hepatobiliary: Unremarkable liver, gallbladder, and biliary tree.   Pancreas:  Unremarkable.   Spleen: Unremarkable.   Adrenals/Urinary Tract: Normal adrenal glands. No urinary calculi or hydronephrosis. Unremarkable bladder.   Stomach/Bowel: Normal caliber large and small bowel. Colonic diverticulosis without diverticulitis. No bowel wall thickening. Normal appendix. Unremarkable stomach.   Lymphatic: No suspicious lymphadenopathy.   Reproductive:  Unremarkable.   Other: No free intraperitoneal fluid or air.   Musculoskeletal: No acute osseous abnormality.   IMPRESSION: 1. No acute vascular abnormality. 2. No CT correlate for epigastric pain.     Electronically Signed   By: Placido Sou M.D.   On: 06/20/2022 03:46 Within last 24 hrs: No results found.  Assessment    Acalculous cholecystitis.  Patient Active Problem List   Diagnosis Date Noted   Acalculous cholecystitis 06/23/2022    Plan    Options of obtaining HIDA d/w pt and his wife.   Will proceed with Robotic chole.   This was discussed thoroughly.  Optimal plan is for robotic cholecystectomy utilizing ICG imaging. Risks and benefits have been discussed with the patient which include but are not limited to anesthesia, bleeding, infection, biliary ductal injury, resulting in leak or stenosis, other associated unanticipated injuries affiliated with laparoscopic surgery.   Reviewed that removing the gallbladder will only address the symptoms related to the gallbladder itself.  I believe there is the desire to proceed, accepting the risks with understanding.  Questions elicited and answered to satisfaction.    No guarantees ever expressed or implied.   Face-to-face time spent with the patient and accompanying care providers(if present) was 40 minutes, with more than 50% of the time spent counseling, educating, and coordinating care of the patient.    These notes generated with voice recognition software. I apologize for typographical errors.  Ronny Bacon M.D., FACS 06/23/2022, 5:53  PM

## 2022-06-22 NOTE — Progress Notes (Unsigned)
Patient ID: Justin Flowers, male   DOB: 05-22-1970, 52 y.o.   MRN: [id number]  Chief Complaint:  Mid-abdominal pain  History of Present Illness Justin Flowers is a 52 y.o. male with a history of intermittent, yet excruciating pain of the upper abdomen, multiple ED visist with associated nausea, and the sense of extreme bloating.  He denies vomiting.  3 yrs ago the first attack began enroute home from the beach.  GI cocktails have been helpful in the past to where the ED would make this available at home.  Last October had sludge on GB  u/s.  Would apply deep pressure to xiphoid to help pain. Fetal positioning, alleviating the anterior abdominal wall tension would be his most tolerable position, to where his back would hurt staying in the position.  GI work-up has been reportedly negative with EGD and colonoscopy revealing nothing.  PPI, pepcid and home GI cocktails no longer provide lasting relief.  Now worse over last 5 days.  Has limited diet to attempt to determine causative factors.    Past Medical History Past Medical History:  Diagnosis Date   Hypertension       Past Surgical History:  Procedure Laterality Date   ANKLE SURGERY      No Known Allergies  Current Outpatient Medications  Medication Sig Dispense Refill   alum & mag hydroxide-simeth (MAALOX MAX) [id number] MG/5ML suspension Take 5 mLs by mouth every 6 (six) hours as needed for indigestion. 355 mL 0   famotidine (PEPCID) 20 MG tablet Take 1 tablet (20 mg total) by mouth 2 (two) times daily. 60 tablet 0   lidocaine (XYLOCAINE) 2 % solution Use as directed 15 mLs in the mouth or throat every 4 (four) hours as needed (For upper abdominal pain). 150 mL 1   lisinopril-hydrochlorothiazide (ZESTORETIC) 20-12.5 MG tablet Take 1 tablet by mouth daily.     ondansetron (ZOFRAN-ODT) 4 MG disintegrating tablet Take 1 tablet (4 mg total) by mouth every 8 (eight) hours as needed for nausea or vomiting. 30 tablet 0    pantoprazole (PROTONIX) 40 MG tablet Take 1 tablet (40 mg total) by mouth daily. 30 tablet 1   pantoprazole (PROTONIX) 40 MG tablet Take 1 tablet (40 mg total) by mouth daily. 30 tablet 1   No current facility-administered medications for this visit.    Family History No family history on file.    Social History Social History   Tobacco Use   Smoking status: Former    Packs/day: 1.00    Years: 6.00    Total pack years: 6.00    Types: Cigarettes    Quit date: 09/13/1995    Years since quitting: 26.7    Passive exposure: Past   Smokeless tobacco: Current  Vaping Use   Vaping Use: Never used  Substance Use Topics   Alcohol use: Not Currently    Alcohol/week: 0.0 standard drinks of alcohol   Drug use: Never        Review of Systems  Constitutional:  Positive for chills and fever.  HENT:  Negative for hearing loss, nosebleeds, sore throat and tinnitus.   Eyes:  Negative for blurred vision, double vision and pain.  Respiratory:  Negative for cough, hemoptysis, sputum production, shortness of breath, wheezing and stridor.   Cardiovascular:  Negative for chest pain, palpitations and leg swelling.  Gastrointestinal:  Positive for abdominal pain and nausea. Negative for blood in stool, constipation, diarrhea, heartburn, melena and vomiting.  Genitourinary:  Negative for dysuria,  flank pain, frequency, hematuria and urgency.  Skin:  Negative for itching and rash.  Psychiatric/Behavioral:  Negative for depression, substance abuse and suicidal ideas.      Physical Exam Blood pressure (!) 132/96, pulse (!) 117, temperature 99.1 F (37.3 C), height '5\' 11"'$  (1.803 m), weight 196 lb (88.9 kg), SpO2 95 %. Last Weight  Most recent update: 06/23/2022  2:47 PM    Weight  88.9 kg (196 lb)             CONSTITUTIONAL: Well developed, and nourished, appropriately responsive and aware without distress.   EYES: Sclera non-icteric.   EARS, NOSE, MOUTH AND THROAT:  The oropharynx is clear.  Oral mucosa is pink and moist.    Hearing is intact to voice.  NECK: Trachea is midline, and there is no jugular venous distension.  LYMPH NODES:  Lymph nodes in the neck are not appreciated. RESPIRATORY:  Lungs are clear, and breath sounds are equal bilaterally.   Normal respiratory effort without pathologic use of accessory muscles. CARDIOVASCULAR: Heart is regular in rhythm.  Rate up 116.  Well perfused.  GI: The abdomen is tenderness in the right upper quadrant and epigastrium.  Otherwise soft, nontender, and nondistended. There were no palpable masses.  I did not appreciate hepatosplenomegaly. There were normal bowel sounds.   MUSCULOSKELETAL:  Symmetrical muscle tone appreciated in all four extremities.    SKIN: Skin turgor is normal. No pathologic skin lesions appreciated.  NEUROLOGIC:  Motor and sensation appear grossly normal.  Cranial nerves are grossly without defect. PSYCH:  Alert and oriented to person, place and time. Affect is appropriate for situation.  Data Reviewed I have personally reviewed what is currently available of the patient's imaging, recent labs and medical records.   Labs:     Latest Ref Rng & Units 06/20/2022    1:37 AM 05/24/2022   11:28 PM 04/07/2022    9:26 AM  CBC  WBC 4.0 - 10.5 K/uL 10.5  6.1  10.9   Hemoglobin 13.0 - 17.0 g/dL 16.0  14.8  17.4   Hematocrit 39.0 - 52.0 % 48.2  43.4  52.3   Platelets 150 - 400 K/uL 268  189  307       Latest Ref Rng & Units 06/20/2022    1:37 AM 05/24/2022   11:28 PM 04/07/2022    9:26 AM  CMP  Glucose 70 - 99 mg/dL 125  101  141   BUN 6 - 20 mg/dL '12  11  12   '$ Creatinine 0.61 - 1.24 mg/dL 1.30  1.02  1.28   Sodium 135 - 145 mmol/L 137  137  136   Potassium 3.5 - 5.1 mmol/L 3.6  3.3  3.3   Chloride 98 - 111 mmol/L 99  104  98   CO2 22 - 32 mmol/L '28  24  27   '$ Calcium 8.9 - 10.3 mg/dL 9.4  8.7  9.7   Total Protein 6.5 - 8.1 g/dL 7.9  6.7  8.4   Total Bilirubin 0.3 - 1.2 mg/dL 1.0  1.7  1.7   Alkaline Phos 38 - 126  U/L 60  46  58   AST 15 - 41 U/L '21  20  25   '$ ALT 0 - 44 U/L '21  21  21     '$ Imaging: Radiological images reviewed:  CLINICAL DATA:  Abdominal pain.   EXAM: ULTRASOUND ABDOMEN LIMITED RIGHT UPPER QUADRANT   COMPARISON:  Ultrasound dated 01/17/2022.  FINDINGS: Gallbladder:   No gallstones or wall thickening visualized. No sonographic Murphy sign noted by sonographer.   Common bile duct:   Diameter: 2 mm   Liver:   There is diffuse increased liver echogenicity most commonly seen in the setting of fatty infiltration. Superimposed inflammation or fibrosis is not excluded. Clinical correlation is recommended. Portal vein is patent on color Doppler imaging with normal direction of blood flow towards the liver.   Other: None.   IMPRESSION: Fatty liver, otherwise unremarkable right upper quadrant ultrasound.     Electronically Signed   By: Anner Crete M.D.   On: 05/24/2022 22:22 CLINICAL DATA:  51 year old male with postprandial upper abdominal pain.   EXAM: ULTRASOUND ABDOMEN LIMITED RIGHT UPPER QUADRANT   COMPARISON:  CT Abdomen and Pelvis 11/10/2020. Abdomen ultrasound 02/06/2019.   FINDINGS: Gallbladder:   Layering sludge within the gallbladder. Gallbladder wall thickness remains within normal limits. No shadowing echogenic stones identified. No pericholecystic fluid. No sonographic Murphy sign elicited.   Common bile duct:   Diameter: 2-3 mm, normal.   Liver:   Chronically echogenic liver (image 27) and some hepatic steatosis demonstrated by CT last year. No discrete liver lesion. No intrahepatic biliary ductal dilatation. Portal vein is patent on color Doppler imaging with normal direction of blood flow towards the liver.   Other: Negative visible right kidney.  No free fluid.   IMPRESSION: 1. Gallbladder sludge but no evidence of stones or acute cholecystitis. 2. Chronic fatty liver disease. No evidence of bile duct obstruction.      Electronically Signed   By: Genevie Ann M.D.   On: 01/17/2022 05:26  CLINICAL DATA:  Epigastric pain. Question esophageal spasm versus ischemia.   EXAM: CTA ABDOMEN AND PELVIS WITHOUT AND WITH CONTRAST   TECHNIQUE: Multidetector CT imaging of the abdomen and pelvis was performed using the standard protocol during bolus administration of intravenous contrast. Multiplanar reconstructed images and MIPs were obtained and reviewed to evaluate the vascular anatomy.   RADIATION DOSE REDUCTION: This exam was performed according to the departmental dose-optimization program which includes automated exposure control, adjustment of the mA and/or kV according to patient size and/or use of iterative reconstruction technique.   CONTRAST:  158m OMNIPAQUE IOHEXOL 350 MG/ML SOLN   COMPARISON:  CT abdomen and pelvis 05/25/2008   FINDINGS: VASCULAR   Aorta: Normal caliber aorta without aneurysm, dissection, vasculitis or significant stenosis.   Celiac: Patent without evidence of aneurysm, dissection, vasculitis or significant stenosis.   SMA: Patent without evidence of aneurysm, dissection, vasculitis or significant stenosis.   Renals: Both renal arteries are patent without evidence of aneurysm, dissection, vasculitis, fibromuscular dysplasia or significant stenosis.   IMA: Patent without evidence of aneurysm, dissection, vasculitis or significant stenosis.   Inflow: Patent without evidence of aneurysm, dissection, vasculitis or significant stenosis.   Proximal Outflow: Bilateral common femoral and visualized portions of the superficial and profunda femoral arteries are patent without evidence of aneurysm, dissection, vasculitis or significant stenosis.   Veins: Patent portal veins.   Review of the MIP images confirms the above findings.   NON-VASCULAR   Lower chest: No acute abnormality.   Hepatobiliary: Unremarkable liver, gallbladder, and biliary tree.   Pancreas:  Unremarkable.   Spleen: Unremarkable.   Adrenals/Urinary Tract: Normal adrenal glands. No urinary calculi or hydronephrosis. Unremarkable bladder.   Stomach/Bowel: Normal caliber large and small bowel. Colonic diverticulosis without diverticulitis. No bowel wall thickening. Normal appendix. Unremarkable stomach.   Lymphatic: No suspicious lymphadenopathy.   Reproductive:  Unremarkable.   Other: No free intraperitoneal fluid or air.   Musculoskeletal: No acute osseous abnormality.   IMPRESSION: 1. No acute vascular abnormality. 2. No CT correlate for epigastric pain.     Electronically Signed   By: Placido Sou M.D.   On: 06/20/2022 03:46 Within last 24 hrs: No results found.  Assessment    Acalculous cholecystitis.  Patient Active Problem List   Diagnosis Date Noted   Acalculous cholecystitis 06/23/2022    Plan    Options of obtaining HIDA d/w pt and his wife.   Will proceed with Robotic chole.   This was discussed thoroughly.  Optimal plan is for robotic cholecystectomy utilizing ICG imaging. Risks and benefits have been discussed with the patient which include but are not limited to anesthesia, bleeding, infection, biliary ductal injury, resulting in leak or stenosis, other associated unanticipated injuries affiliated with laparoscopic surgery.   Reviewed that removing the gallbladder will only address the symptoms related to the gallbladder itself.  I believe there is the desire to proceed, accepting the risks with understanding.  Questions elicited and answered to satisfaction.    No guarantees ever expressed or implied.   Face-to-face time spent with the patient and accompanying care providers(if present) was 40 minutes, with more than 50% of the time spent counseling, educating, and coordinating care of the patient.    These notes generated with voice recognition software. I apologize for typographical errors.  Ronny Bacon M.D., FACS 06/23/2022, 5:53  PM

## 2022-06-23 ENCOUNTER — Ambulatory Visit (INDEPENDENT_AMBULATORY_CARE_PROVIDER_SITE_OTHER): Payer: 59 | Admitting: Surgery

## 2022-06-23 ENCOUNTER — Encounter: Payer: Self-pay | Admitting: Surgery

## 2022-06-23 ENCOUNTER — Telehealth: Payer: Self-pay | Admitting: Surgery

## 2022-06-23 ENCOUNTER — Ambulatory Visit: Payer: Self-pay | Admitting: Surgery

## 2022-06-23 VITALS — BP 132/96 | HR 117 | Temp 99.1°F | Ht 71.0 in | Wt 196.0 lb

## 2022-06-23 DIAGNOSIS — K819 Cholecystitis, unspecified: Secondary | ICD-10-CM | POA: Diagnosis not present

## 2022-06-23 MED ORDER — CHLORHEXIDINE GLUCONATE CLOTH 2 % EX PADS: 6.0000 | MEDICATED_PAD | Freq: Once | CUTANEOUS | Status: DC

## 2022-06-23 MED ORDER — CELECOXIB 200 MG PO CAPS: 200.0000 mg | ORAL_CAPSULE | ORAL | Status: AC

## 2022-06-23 MED ORDER — BUPIVACAINE LIPOSOME 1.3 % IJ SUSP: 20.0000 mL | Freq: Once | INTRAMUSCULAR | Status: DC

## 2022-06-23 MED ORDER — CEFAZOLIN SODIUM-DEXTROSE 2-4 GM/100ML-% IV SOLN
2.0000 g | INTRAVENOUS | Status: AC
  Administered 2022-06-24: 2 g via INTRAVENOUS

## 2022-06-23 MED ORDER — GABAPENTIN 300 MG PO CAPS: 300.0000 mg | ORAL_CAPSULE | ORAL | Status: AC

## 2022-06-23 MED ORDER — INDOCYANINE GREEN 25 MG IV SOLR
1.2500 mg | Freq: Once | INTRAVENOUS | Status: AC
  Administered 2022-06-24: 1.25 mg via INTRAVENOUS
  Filled 2022-06-23: qty 0.5

## 2022-06-23 MED ORDER — ACETAMINOPHEN 500 MG PO TABS: 1000.0000 mg | ORAL_TABLET | ORAL | Status: AC

## 2022-06-23 NOTE — Patient Instructions (Signed)
You have requested to have your gallbladder removed. This will be done at [facility] with Dr. Rodenberg.  You will most likely be out of work 1-2 weeks for this surgery.  If you have FMLA or disability paperwork that needs filled out you may drop this off at our office or this can be faxed to [phone].  You will return after your post-op appointment with a lifting restriction for approximately 4 more weeks.  You will be able to eat anything you would like to following surgery. But, start by eating a bland diet and advance this as tolerated. The Gallbladder diet is below, please go as closely by this diet as possible prior to surgery to avoid any further attacks.  Please see the (blue)pre-care form that you have been given today. Our surgery scheduler will call you to verify surgery date and to go over information.   If you have any questions, please call our office.  Laparoscopic Cholecystectomy Laparoscopic cholecystectomy is surgery to remove the gallbladder. The gallbladder is located in the upper right part of the abdomen, behind the liver. It is a storage sac for bile, which is produced in the liver. Bile aids in the digestion and absorption of fats. Cholecystectomy is often done for inflammation of the gallbladder (cholecystitis). This condition is usually caused by a buildup of gallstones (cholelithiasis) in the gallbladder. Gallstones can block the flow of bile, and that can result in inflammation and pain. In severe cases, emergency surgery may be required. If emergency surgery is not required, you will have time to prepare for the procedure. Laparoscopic surgery is an alternative to open surgery. Laparoscopic surgery has a shorter recovery time. Your common bile duct may also need to be examined during the procedure. If stones are found in the common bile duct, they may be removed. LET YOUR HEALTH CARE PROVIDER KNOW ABOUT: Any allergies you have. All medicines you are taking,  including vitamins, herbs, eye drops, creams, and over-the-counter medicines. Previous problems you or members of your family have had with the use of anesthetics. Any blood disorders you have. Previous surgeries you have had.  Any medical conditions you have. RISKS AND COMPLICATIONS Generally, this is a safe procedure. However, problems may occur, including: Infection. Bleeding. Allergic reactions to medicines. Damage to other structures or organs. A stone remaining in the common bile duct. A bile leak from the cyst duct that is clipped when your gallbladder is removed. The need to convert to open surgery, which requires a larger incision in the abdomen. This may be necessary if your surgeon thinks that it is not safe to continue with a laparoscopic procedure. BEFORE THE PROCEDURE Ask your health care provider about: Changing or stopping your regular medicines. This is especially important if you are taking diabetes medicines or blood thinners. Taking medicines such as aspirin and ibuprofen. These medicines can thin your blood. Do not take these medicines before your procedure if your health care provider instructs you not to. Follow instructions from your health care provider about eating or drinking restrictions. Let your health care provider know if you develop a cold or an infection before surgery. Plan to have someone take you home after the procedure. Ask your health care provider how your surgical site will be marked or identified. You may be given antibiotic medicine to help prevent infection. PROCEDURE To reduce your risk of infection: Your health care team will wash or sanitize their hands. Your skin will be washed with soap. An IV   tube may be inserted into one of your veins. You will be given a medicine to make you fall asleep (general anesthetic). A breathing tube will be placed in your mouth. The surgeon will make several small cuts (incisions) in your abdomen. A thin,  lighted tube (laparoscope) that has a tiny camera on the end will be inserted through one of the small incisions. The camera on the laparoscope will send a picture to a TV screen (monitor) in the operating room. This will give the surgeon a good view inside your abdomen. A gas will be pumped into your abdomen. This will expand your abdomen to give the surgeon more room to perform the surgery. Other tools that are needed for the procedure will be inserted through the other incisions. The gallbladder will be removed through one of the incisions. After your gallbladder has been removed, the incisions will be closed with stitches (sutures), staples, or skin glue. Your incisions may be covered with a bandage (dressing). The procedure may vary among health care providers and hospitals. AFTER THE PROCEDURE Your blood pressure, heart rate, breathing rate, and blood oxygen level will be monitored often until the medicines you were given have worn off. You will be given medicines as needed to control your pain.   This information is not intended to replace advice given to you by your health care provider. Make sure you discuss any questions you have with your health care provider.   Document Released: 03/30/2005 Document Revised: 12/19/2014 Document Reviewed: 11/09/2012 Elsevier Interactive Patient Education 2016 Else[employer] Inc[employer]   Low-Fat Diet for Gallbladder Conditions A low-fat diet can be helpful if you have pancreatitis or a gallbladder condition. With these conditions, your pancreas and gallbladder have trouble digesting fats. A healthy eating plan with less fat will help rest your pancreas and gallbladder and reduce your symptoms. WHAT DO I NEED TO KNOW ABOUT THIS DIET? Eat a low-fat diet. Reduce your fat intake to less than 20-30% of your total daily calories. This is less than 50-60 g of fat per day. Remember that you need some fat in your diet. Ask your dietician what your daily goal should  be. Choose nonfat and low-fat healthy foods. Look for the words "nonfat," "low fat," or "fat free." As a guide, look on the label and choose foods with less than 3 g of fat per serving. Eat only one serving. Avoid alcohol. Do not smoke. If you need help quitting, talk with your health care provider. Eat small frequent meals instead of three large heavy meals. WHAT FOODS CAN I EAT? Grains Include healthy grains and starches such as potatoes, wheat bread, fiber-rich cereal, and brown rice. Choose whole grain options whenever possible. In adults, whole grains should account for 45-65% of your daily calories.  Fruits and Vegetables Eat plenty of fruits and vegetables. Fresh fruits and vegetables add fiber to your diet. Meats and Other Protein Sources Eat lean meat such as chicken and pork. Trim any fat off of meat before cooking it. Eggs, fish, and beans are other sources of protein. In adults, these foods should account for 10-35% of your daily calories. Dairy Choose low-fat milk and dairy options. Dairy includes fat and protein, as well as calcium.  Fats and Oils Limit high-fat foods such as fried foods, sweets, baked goods, sugary drinks.  Other Creamy sauces and condiments, such as mayonnaise, can add extra fat. Think about whether or not you need to use them, or use smaller amounts or low fat options.   WHAT FOODS ARE NOT RECOMMENDED? High fat foods, such as: Baked goods. Ice cream. French toast. Sweet rolls. Pizza. Cheese bread. Foods covered with batter, butter, creamy sauces, or cheese. Fried foods. Sugary drinks and desserts. Foods that cause gas or bloating   This information is not intended to replace advice given to you by your health care provider. Make sure you discuss any questions you have with your health care provider.   Document Released: 04/04/2013 Document Reviewed: 04/04/2013 Elsevier Interactive Patient Education 2016 Elsevier Inc.   

## 2022-06-23 NOTE — Telephone Encounter (Signed)
Patient has been advised of Pre-Admission date/time, and Surgery date at [facility] while in office today.    Add on for Surgery Date: 06/24/22 Preadmission Testing Date: 06/24/22 (patient to arrive @ 7:00 am)  Patient instructed to be NPO after midnight.

## 2022-06-24 ENCOUNTER — Other Ambulatory Visit: Payer: Self-pay

## 2022-06-24 ENCOUNTER — Ambulatory Visit: Payer: 59 | Admitting: Anesthesiology

## 2022-06-24 ENCOUNTER — Encounter: Payer: Self-pay | Admitting: Surgery

## 2022-06-24 ENCOUNTER — Ambulatory Visit
Admission: RE | Admit: 2022-06-24 | Discharge: 2022-06-24 | Disposition: A | Discharge status: Home / Self Care | Payer: 59 | Source: Ambulatory Visit | Attending: Surgery | Admitting: Surgery

## 2022-06-24 ENCOUNTER — Encounter
Admission: RE | Disposition: A | Discharge status: Home / Self Care | Payer: Self-pay | Source: Ambulatory Visit | Attending: Surgery

## 2022-06-24 DIAGNOSIS — Z87891 Personal history of nicotine dependence: Secondary | ICD-10-CM | POA: Diagnosis not present

## 2022-06-24 DIAGNOSIS — K819 Cholecystitis, unspecified: Secondary | ICD-10-CM

## 2022-06-24 DIAGNOSIS — K8000 Calculus of gallbladder with acute cholecystitis without obstruction: Secondary | ICD-10-CM | POA: Diagnosis present

## 2022-06-24 DIAGNOSIS — Z79899 Other long term (current) drug therapy: Secondary | ICD-10-CM | POA: Diagnosis not present

## 2022-06-24 DIAGNOSIS — I10 Essential (primary) hypertension: Secondary | ICD-10-CM | POA: Insufficient documentation

## 2022-06-24 DIAGNOSIS — I96 Gangrene, not elsewhere classified: Secondary | ICD-10-CM | POA: Diagnosis not present

## 2022-06-24 LAB — GLUCOSE, CAPILLARY: Glucose-Capillary: 124 mg/dL — ABNORMAL HIGH (ref 70–99)

## 2022-06-24 SURGERY — CHOLECYSTECTOMY, ROBOT-ASSISTED, LAPAROSCOPIC
Anesthesia: General | Site: Abdomen

## 2022-06-24 MED ORDER — CELECOXIB 200 MG PO CAPS
ORAL_CAPSULE | ORAL | Status: AC
  Administered 2022-06-24: 200 mg via ORAL
  Filled 2022-06-24: qty 1

## 2022-06-24 MED ORDER — BUPIVACAINE LIPOSOME 1.3 % IJ SUSP
INTRAMUSCULAR | Status: AC
  Filled 2022-06-24: qty 20

## 2022-06-24 MED ORDER — MIDAZOLAM HCL 2 MG/2ML IJ SOLN
INTRAMUSCULAR | Status: DC | PRN
  Administered 2022-06-24: 2 mg via INTRAVENOUS

## 2022-06-24 MED ORDER — CHLORHEXIDINE GLUCONATE 0.12 % MT SOLN: 15.0000 mL | Freq: Once | OROMUCOSAL | Status: AC

## 2022-06-24 MED ORDER — SUGAMMADEX SODIUM 200 MG/2ML IV SOLN
INTRAVENOUS | Status: DC | PRN
  Administered 2022-06-24: 200 mg via INTRAVENOUS

## 2022-06-24 MED ORDER — OXYCODONE HCL 5 MG PO TABS
5.0000 mg | ORAL_TABLET | Freq: Once | ORAL | Status: AC | PRN
  Administered 2022-06-24: 5 mg via ORAL

## 2022-06-24 MED ORDER — MIDAZOLAM HCL 2 MG/2ML IJ SOLN
INTRAMUSCULAR | Status: AC
  Filled 2022-06-24: qty 2

## 2022-06-24 MED ORDER — ACETAMINOPHEN 500 MG PO TABS
ORAL_TABLET | ORAL | Status: AC
  Administered 2022-06-24: 1000 mg via ORAL
  Filled 2022-06-24: qty 2

## 2022-06-24 MED ORDER — 0.9 % SODIUM CHLORIDE (POUR BTL) OPTIME
TOPICAL | Status: DC | PRN
  Administered 2022-06-24: 500 mL

## 2022-06-24 MED ORDER — ESMOLOL HCL 100 MG/10ML IV SOLN
INTRAVENOUS | Status: DC | PRN
  Administered 2022-06-24 (×2): 10 mg via INTRAVENOUS
  Administered 2022-06-24: 20 mg via INTRAVENOUS

## 2022-06-24 MED ORDER — CEFAZOLIN SODIUM-DEXTROSE 2-4 GM/100ML-% IV SOLN
INTRAVENOUS | Status: AC
  Filled 2022-06-24: qty 100

## 2022-06-24 MED ORDER — OXYCODONE HCL 5 MG PO TABS: 5.0000 mg | ORAL_TABLET | Freq: Four times a day (QID) | ORAL | 0 refills | Status: DC | PRN

## 2022-06-24 MED ORDER — IBUPROFEN 800 MG PO TABS: 800.0000 mg | ORAL_TABLET | Freq: Three times a day (TID) | ORAL | 0 refills | Status: DC | PRN

## 2022-06-24 MED ORDER — PROPOFOL 10 MG/ML IV BOLUS
INTRAVENOUS | Status: DC | PRN
  Administered 2022-06-24: 200 mg via INTRAVENOUS

## 2022-06-24 MED ORDER — LACTATED RINGERS IV SOLN: INTRAVENOUS | Status: DC

## 2022-06-24 MED ORDER — FENTANYL CITRATE (PF) 100 MCG/2ML IJ SOLN
INTRAMUSCULAR | Status: DC | PRN
  Administered 2022-06-24 (×6): 50 ug via INTRAVENOUS

## 2022-06-24 MED ORDER — CHLORHEXIDINE GLUCONATE 0.12 % MT SOLN
OROMUCOSAL | Status: AC
  Administered 2022-06-24: 15 mL via OROMUCOSAL
  Filled 2022-06-24: qty 15

## 2022-06-24 MED ORDER — SODIUM CHLORIDE 0.9 % IR SOLN
Status: DC | PRN
  Administered 2022-06-24: 3000 mL

## 2022-06-24 MED ORDER — BUPIVACAINE-EPINEPHRINE (PF) 0.25% -1:200000 IJ SOLN
INTRAMUSCULAR | Status: DC | PRN
  Administered 2022-06-24: 30 mL

## 2022-06-24 MED ORDER — ROCURONIUM BROMIDE 100 MG/10ML IV SOLN
INTRAVENOUS | Status: DC | PRN
  Administered 2022-06-24: 60 mg via INTRAVENOUS
  Administered 2022-06-24: 10 mg via INTRAVENOUS

## 2022-06-24 MED ORDER — OXYCODONE HCL 5 MG/5ML PO SOLN: 5.0000 mg | Freq: Once | ORAL | Status: AC | PRN

## 2022-06-24 MED ORDER — GABAPENTIN 300 MG PO CAPS
ORAL_CAPSULE | ORAL | Status: AC
  Administered 2022-06-24: 300 mg via ORAL
  Filled 2022-06-24: qty 1

## 2022-06-24 MED ORDER — BUPIVACAINE HCL (PF) 0.25 % IJ SOLN
INTRAMUSCULAR | Status: AC
  Filled 2022-06-24: qty 30

## 2022-06-24 MED ORDER — EPINEPHRINE PF 1 MG/ML IJ SOLN
INTRAMUSCULAR | Status: AC
  Filled 2022-06-24: qty 1

## 2022-06-24 MED ORDER — EPHEDRINE SULFATE (PRESSORS) 50 MG/ML IJ SOLN
INTRAMUSCULAR | Status: DC | PRN
  Administered 2022-06-24 (×2): 5 mg via INTRAVENOUS

## 2022-06-24 MED ORDER — DEXMEDETOMIDINE HCL IN NACL 200 MCG/50ML IV SOLN
INTRAVENOUS | Status: DC | PRN
  Administered 2022-06-24: 12 ug via INTRAVENOUS
  Administered 2022-06-24: 8 ug via INTRAVENOUS
  Administered 2022-06-24: 4 ug via INTRAVENOUS
  Administered 2022-06-24: 12 ug via INTRAVENOUS

## 2022-06-24 MED ORDER — PHENYLEPHRINE 80 MCG/ML (10ML) SYRINGE FOR IV PUSH (FOR BLOOD PRESSURE SUPPORT)
PREFILLED_SYRINGE | INTRAVENOUS | Status: DC | PRN
  Administered 2022-06-24 (×3): 160 ug via INTRAVENOUS
  Administered 2022-06-24 (×2): 80 ug via INTRAVENOUS
  Administered 2022-06-24 (×2): 160 ug via INTRAVENOUS
  Administered 2022-06-24 (×2): 80 ug via INTRAVENOUS
  Administered 2022-06-24: 160 ug via INTRAVENOUS

## 2022-06-24 MED ORDER — HYDROMORPHONE HCL 1 MG/ML IJ SOLN
0.2500 mg | INTRAMUSCULAR | Status: DC | PRN
  Administered 2022-06-24: 0.5 mg via INTRAVENOUS

## 2022-06-24 MED ORDER — LIDOCAINE HCL (CARDIAC) PF 100 MG/5ML IV SOSY
PREFILLED_SYRINGE | INTRAVENOUS | Status: DC | PRN
  Administered 2022-06-24: 100 mg via INTRAVENOUS

## 2022-06-24 MED ORDER — FENTANYL CITRATE (PF) 100 MCG/2ML IJ SOLN
INTRAMUSCULAR | Status: AC
  Filled 2022-06-24: qty 2

## 2022-06-24 MED ORDER — HYDROMORPHONE HCL 1 MG/ML IJ SOLN
INTRAMUSCULAR | Status: AC
  Administered 2022-06-24: 0.5 mg via INTRAVENOUS
  Filled 2022-06-24: qty 1

## 2022-06-24 MED ORDER — ORAL CARE MOUTH RINSE: 15.0000 mL | Freq: Once | OROMUCOSAL | Status: AC

## 2022-06-24 MED ORDER — PHENYLEPHRINE HCL (PRESSORS) 10 MG/ML IV SOLN
INTRAVENOUS | Status: AC
  Filled 2022-06-24: qty 1

## 2022-06-24 MED ORDER — GLYCOPYRROLATE 0.2 MG/ML IJ SOLN
INTRAMUSCULAR | Status: DC | PRN
  Administered 2022-06-24: .2 mg via INTRAVENOUS

## 2022-06-24 MED ORDER — PHENYLEPHRINE 80 MCG/ML (10ML) SYRINGE FOR IV PUSH (FOR BLOOD PRESSURE SUPPORT)
PREFILLED_SYRINGE | INTRAVENOUS | Status: AC
  Filled 2022-06-24: qty 10

## 2022-06-24 MED ORDER — OXYCODONE HCL 5 MG PO TABS
ORAL_TABLET | ORAL | Status: AC
  Filled 2022-06-24: qty 1

## 2022-06-24 MED ORDER — ONDANSETRON HCL 4 MG/2ML IJ SOLN
INTRAMUSCULAR | Status: DC | PRN
  Administered 2022-06-24 (×2): 4 mg via INTRAVENOUS

## 2022-06-24 MED ORDER — DEXAMETHASONE SODIUM PHOSPHATE 10 MG/ML IJ SOLN
INTRAMUSCULAR | Status: DC | PRN
  Administered 2022-06-24: 10 mg via INTRAVENOUS

## 2022-06-24 SURGICAL SUPPLY — 47 items
ADH SKN CLS APL DERMABOND .7 (GAUZE/BANDAGES/DRESSINGS) ×1
BAG PRESSURE INF REUSE 3000 (BAG) IMPLANT
CLIP LIGATING HEM O LOK PURPLE (MISCELLANEOUS) ×1 IMPLANT
COVER TIP SHEARS 8 DVNC (MISCELLANEOUS) ×1 IMPLANT
COVER TIP SHEARS 8MM DA VINCI (MISCELLANEOUS) ×1
DERMABOND ADVANCED .7 DNX12 (GAUZE/BANDAGES/DRESSINGS) ×1 IMPLANT
DRAPE ARM DVNC X/XI (DISPOSABLE) ×4 IMPLANT
DRAPE COLUMN DVNC XI (DISPOSABLE) ×1 IMPLANT
DRAPE DA VINCI XI ARM (DISPOSABLE) ×4
DRAPE DA VINCI XI COLUMN (DISPOSABLE) ×1
ELECT CAUTERY BLADE 6.4 (BLADE) ×1 IMPLANT
GLOVE ORTHO TXT STRL SZ7.5 (GLOVE) ×2 IMPLANT
GOWN STRL REUS W/ TWL LRG LVL3 (GOWN DISPOSABLE) ×2 IMPLANT
GOWN STRL REUS W/ TWL XL LVL3 (GOWN DISPOSABLE) ×2 IMPLANT
GOWN STRL REUS W/TWL LRG LVL3 (GOWN DISPOSABLE) ×2
GOWN STRL REUS W/TWL XL LVL3 (GOWN DISPOSABLE) ×2
GRASPER SUT TROCAR 14GX15 (MISCELLANEOUS) ×1 IMPLANT
IRRIGATION STRYKERFLOW (MISCELLANEOUS) IMPLANT
IRRIGATOR STRYKERFLOW (MISCELLANEOUS)
IRRIGATOR SUCT 8 DISP DVNC XI (IRRIGATION / IRRIGATOR) IMPLANT
IRRIGATOR SUCTION 8MM XI DISP (IRRIGATION / IRRIGATOR) ×1
IV NS IRRIG 3000ML ARTHROMATIC (IV SOLUTION) IMPLANT
KIT PINK PAD W/HEAD ARE REST (MISCELLANEOUS) ×1 IMPLANT
KIT PINK PAD W/HEAD ARM REST (MISCELLANEOUS) ×1 IMPLANT
KIT TURNOVER KIT A (KITS) ×1 IMPLANT
LABEL OR SOLS (LABEL) ×1 IMPLANT
MANIFOLD NEPTUNE II (INSTRUMENTS) ×1 IMPLANT
NDL INSUFFLATION 14GA 120MM (NEEDLE) ×1 IMPLANT
NEEDLE HYPO 22GX1.5 SAFETY (NEEDLE) ×1 IMPLANT
NEEDLE INSUFFLATION 14GA 120MM (NEEDLE) ×1 IMPLANT
NS IRRIG 500ML POUR BTL (IV SOLUTION) ×1 IMPLANT
PACK LAP CHOLECYSTECTOMY (MISCELLANEOUS) ×1 IMPLANT
SEAL CANN UNIV 5-8 DVNC XI (MISCELLANEOUS) ×4 IMPLANT
SEAL XI 5MM-8MM UNIVERSAL (MISCELLANEOUS) ×4
SET TUBE SMOKE EVAC HIGH FLOW (TUBING) ×1 IMPLANT
SOL ELECTROSURG ANTI STICK (MISCELLANEOUS) ×1
SOLUTION ELECTROSURG ANTI STCK (MISCELLANEOUS) ×1 IMPLANT
SPIKE FLUID TRANSFER (MISCELLANEOUS) ×1 IMPLANT
SUT MNCRL 4-0 (SUTURE) ×2
SUT MNCRL 4-0 27XMFL (SUTURE) ×2
SUT VICRYL 0 UR6 27IN ABS (SUTURE) ×1 IMPLANT
SUTURE MNCRL 4-0 27XMF (SUTURE) ×1 IMPLANT
SYS BAG RETRIEVAL 10MM (BASKET) ×1
SYSTEM BAG RETRIEVAL 10MM (BASKET) ×1 IMPLANT
TRAP FLUID SMOKE EVACUATOR (MISCELLANEOUS) ×1 IMPLANT
TROCAR Z-THREAD FIOS 11X100 BL (TROCAR) ×1 IMPLANT
WATER STERILE IRR 500ML POUR (IV SOLUTION) ×1 IMPLANT

## 2022-06-24 NOTE — Transfer of Care (Signed)
Immediate Anesthesia Transfer of Care Note  Patient: Justin Flowers  Procedure(s) Performed: XI ROBOTIC ASSISTED LAPAROSCOPIC CHOLECYSTECTOMY (Abdomen) INDOCYANINE GREEN FLUORESCENCE IMAGING (ICG) (Abdomen)  Patient Location: PACU  Anesthesia Type:General  Level of Consciousness: awake, drowsy, and patient cooperative  Airway & Oxygen Therapy: Patient Spontanous Breathing and Patient connected to face mask oxygen  Post-op Assessment: Report given to RN and Post -op Vital signs reviewed and stable  Post vital signs: Reviewed and stable  Last Vitals:  Vitals Value Taken Time  BP    Temp    Pulse 99 06/24/22 1253  Resp 17 06/24/22 1253  SpO2 94 % 06/24/22 1253  Vitals shown include unvalidated device data.  Last Pain:  Vitals:   06/24/22 0741  TempSrc: Oral  PainSc: 3          Complications: No notable events documented.

## 2022-06-24 NOTE — Discharge Instructions (Addendum)

## 2022-06-24 NOTE — Anesthesia Postprocedure Evaluation (Signed)
Anesthesia Post Note  Patient: Justin Flowers  Procedure(s) Performed: XI ROBOTIC ASSISTED LAPAROSCOPIC CHOLECYSTECTOMY (Abdomen) INDOCYANINE GREEN FLUORESCENCE IMAGING (ICG) (Abdomen)  Patient location during evaluation: PACU Anesthesia Type: General Level of consciousness: awake and alert Pain management: pain level controlled Vital Signs Assessment: post-procedure vital signs reviewed and stable Respiratory status: spontaneous breathing, nonlabored ventilation, respiratory function stable and patient connected to nasal cannula oxygen Cardiovascular status: blood pressure returned to baseline and stable Postop Assessment: no apparent nausea or vomiting Anesthetic complications: no   No notable events documented.   Last Vitals:  Vitals:   06/24/22 1252 06/24/22 1300  BP: 118/80 118/81  Pulse: 97 100  Resp: 19 14  Temp: (!) 36.1 C   SpO2: 94% 94%    Last Pain:  Vitals:   06/24/22 1252  TempSrc:   PainSc: 8                  Ilene Qua

## 2022-06-24 NOTE — Anesthesia Procedure Notes (Signed)
Procedure Name: Intubation Date/Time: 06/24/2022 10:25 AM  Performed by: Kelton Pillar, CRNAPre-anesthesia Checklist: Patient identified, Emergency Drugs available, Suction available and Patient being monitored Patient Re-evaluated:Patient Re-evaluated prior to induction Oxygen Delivery Method: Circle system utilized Preoxygenation: Pre-oxygenation with 100% oxygen Induction Type: IV induction Ventilation: Mask ventilation without difficulty Laryngoscope Size: McGraph and 3 Grade View: Grade I Tube type: Oral Tube size: 7.0 mm Number of attempts: 1 Airway Equipment and Method: Stylet and Oral airway Placement Confirmation: ETT inserted through vocal cords under direct vision, positive ETCO2, breath sounds checked- equal and bilateral and CO2 detector Secured at: 21 cm Tube secured with: Tape Dental Injury: Teeth and Oropharynx as per pre-operative assessment

## 2022-06-24 NOTE — Op Note (Signed)
Robotic cholecystectomy with Indocyamine Green Ductal Imaging.   Pre-operative Diagnosis: Acalculus cholecystitis  Post-operative Diagnosis: Acute calculus cholecystitis.  Procedure: Robotic assisted laparoscopic cholecystectomy with Indocyamine Green Ductal Imaging.   Surgeon: Ronny Bacon, M.D., FACS  Anesthesia: General. with endotracheal tube  Findings: Acute on chronic inflammatory changes, foci of gallbladder wall gangrene, 1 cm solitary Mulbury stone.  ICG fluorescence to identify the cystic duct.  No ICG fluorescence within the gallbladder.  Estimated Blood Loss: 15 mL         Drains: None         Specimens: Gallbladder           Complications: none  Procedure Details  The patient was seen again in the Holding Room.  1.25 mg dose of ICG was administered intravenously.   The benefits, complications, treatment options, risks and expected outcomes were again reviewed with the patient. The likelihood of improving the patient's symptoms with return to their baseline status is good.  The patient and/or family concurred with the proposed plan, giving informed consent, again alternatives reviewed.  The patient was taken to Operating Room, identified, and the procedure verified as robotic assisted laparoscopic cholecystectomy.  Prior to the induction of general anesthesia, antibiotic prophylaxis was administered. VTE prophylaxis was in place. General endotracheal anesthesia was then administered and tolerated well. The patient was positioned in the supine position.  After the induction, the abdomen was prepped with Chloraprep and draped in the sterile fashion.  A Time Out was held and the above information confirmed.  After local infiltration of quarter percent Marcaine with epinephrine, stab incision was made left upper quadrant.  Just below the costal margin at Palmer's point, approximately midclavicular line the Veres needle is passed with sensation of the layers to penetrate the  abdominal wall and into the peritoneum.  Saline drop test is confirmed peritoneal placement.  Insufflation is initiated with carbon dioxide to pressures of 15 mmHg.  Right para-umbilical local infiltration with quarter percent Marcaine with epinephrine is utilized.  Made a 12 mm incision on the right periumbilical site, I advanced an optical 32m port under direct visualization into the peritoneal cavity.  Once the peritoneum was penetrated, insufflation was initiated.  The trocar was then advanced into the abdominal cavity under direct visualization. Pneumoperitoneum was then continued utilizing CO2 at 15 mmHg or less and tolerated well without any adverse changes in the patient's vital signs.  Two 8.5-mm ports were placed in the left lower quadrant and laterally, and one to the right lower quadrant, all under direct vision. All skin incisions  were infiltrated with a local anesthetic agent before making the incision and placing the trocars.  The patient was positioned  in reverse Trendelenburg, tilted the patient's left side down.  Da Vinci XI robot was then positioned on to the patient's left side, and docked. The gallbladder was encased in an omental wrap, this was bluntly peeled away. The gallbladder was identified, the fundus grasped via the arm 4 Prograsp and retracted cephalad. Adhesions were lysed with scissors and cautery.  Suction irrigator was utilized to help with the dissection and the extensive amount of oozing to allow adequate visualization of the dissection.  The infundibulum was identified grasped and retracted laterally, exposing the peritoneum overlying the triangle of Calot. This was then opened and dissected using cautery & scissors, and blunt dissection using the suction irrigator tip. An extended critical view of the cystic duct was obtained, aided by the ICG via FireFly which clearly demarcated the  cystic duct.     The cystic duct was clearly identified and dissected to isolation.    The cystic duct was double clipped and divided with scissors, as close to the gallbladder neck as feasible, thus leaving two clips on the remaining cystic duct stump.  The cystic artery is sealed with bipolar and divided with monopolar scissors.   The gallbladder was taken from the gallbladder fossa in a retrograde fashion with the electrocautery, I also utilized blunt dissection with robotic suction tip.  The gallbladder was quite intrahepatic.  The gallbladder was removed and placed in an Endocatch bag.  The liver bed is inspected. Hemostasis was confirmed.  The robot was undocked and moved away from the operative field. 3 L irrigation was utilized and was aspirated clear.  The gallbladder and Endocatch sac were then removed through the infraumbilical port site.   Inspection of the right upper quadrant was performed. No bleeding, bile duct injury or leak, or bowel injury was noted. The infra-umbilical port site fascia was closed with interrumpted 0 Vicryl sutures using PMI/cone under direct visualization. Pneumoperitoneum was released and ports removed.  4-0 subcuticular Monocryl was used to close the skin. Dermabond was  applied.  The patient was then extubated and brought to the recovery room in stable condition. Sponge, lap, and needle counts were correct at closure and at the conclusion of the case.               Ronny Bacon, M.D., [facility] 06/24/2022 12:43 PM

## 2022-06-24 NOTE — Interval H&P Note (Signed)
History and Physical Interval Note:  06/24/2022 10:02 AM  Justin Flowers  has presented today for surgery, with the diagnosis of gallbladder.  The various methods of treatment have been discussed with the patient and family. After consideration of risks, benefits and other options for treatment, the patient has consented to  Procedure(s): XI ROBOTIC ASSISTED LAPAROSCOPIC CHOLECYSTECTOMY (N/A) [place] (ICG) (N/A) as a surgical intervention.  The patient's history has been reviewed, patient examined, no change in status, stable for surgery.  I have reviewed the patient's chart and labs.  Questions were answered to the patient's satisfaction.     Ronny Bacon

## 2022-06-24 NOTE — Anesthesia Preprocedure Evaluation (Signed)
Anesthesia Evaluation  Patient identified by MRN, date of birth, ID band Patient awake    Reviewed: Allergy & Precautions, NPO status , Patient's Chart, lab work & pertinent test results  History of Anesthesia Complications Negative for: history of anesthetic complications  Airway Mallampati: IV  TM Distance: >3 FB Neck ROM: full   Comment: Small mouth opening  Dental no notable dental hx.    Pulmonary neg pulmonary ROS, former smoker   Pulmonary exam normal        Cardiovascular hypertension, On Medications negative cardio ROS Normal cardiovascular exam     Neuro/Psych negative neurological ROS  negative psych ROS   GI/Hepatic negative GI ROS, Neg liver ROS,,,  Endo/Other  negative endocrine ROS    Renal/GU      Musculoskeletal   Abdominal   Peds  Hematology negative hematology ROS (+)   Anesthesia Other Findings Past Medical History: No date: Hypertension  Past Surgical History: No date: ANKLE SURGERY  BMI    Body Mass Index: 26.50 kg/m      Reproductive/Obstetrics negative OB ROS                             Anesthesia Physical Anesthesia Plan  ASA: 2  Anesthesia Plan: General ETT   Post-op Pain Management: Tylenol PO (pre-op)*, Gabapentin PO (pre-op)*, Celebrex PO (pre-op)* and Dilaudid IV   Induction: Intravenous  PONV Risk Score and Plan: Ondansetron, Dexamethasone, Midazolam and Treatment may vary due to age or medical condition  Airway Management Planned: Oral ETT  Additional Equipment:   Intra-op Plan:   Post-operative Plan: Extubation in OR  Informed Consent: I have reviewed the patients History and Physical, chart, labs and discussed the procedure including the risks, benefits and alternatives for the proposed anesthesia with the patient or authorized representative who has indicated his/her understanding and acceptance.     Dental Advisory  Given  Plan Discussed with: Anesthesiologist, CRNA and Surgeon  Anesthesia Plan Comments: (Patient consented for risks of anesthesia including but not limited to:  - adverse reactions to medications - damage to eyes, teeth, lips or other oral mucosa - nerve damage due to positioning  - sore throat or hoarseness - Damage to heart, brain, nerves, lungs, other parts of body or loss of life  Patient voiced understanding.)       Anesthesia Quick Evaluation

## 2022-06-25 ENCOUNTER — Other Ambulatory Visit: Payer: Self-pay | Admitting: Surgery

## 2022-06-25 ENCOUNTER — Encounter: Payer: Self-pay | Admitting: Surgery

## 2022-06-25 LAB — SURGICAL PATHOLOGY

## 2022-06-25 MED ORDER — OXYCODONE HCL 5 MG PO TABS: 5.0000 mg | ORAL_TABLET | Freq: Four times a day (QID) | ORAL | 0 refills | Status: DC | PRN

## 2022-06-29 ENCOUNTER — Other Ambulatory Visit: Payer: Self-pay | Admitting: Surgery

## 2022-06-29 ENCOUNTER — Encounter: Payer: Self-pay | Admitting: Surgery

## 2022-06-29 MED ORDER — OXYCODONE HCL 5 MG PO TABS: 5.0000 mg | ORAL_TABLET | Freq: Four times a day (QID) | ORAL | 0 refills | Status: DC | PRN

## 2022-06-30 ENCOUNTER — Telehealth: Payer: Self-pay | Admitting: *Deleted

## 2022-06-30 NOTE — Telephone Encounter (Signed)
Faxed Fitness for Duty Form to [phone]

## 2022-07-03 ENCOUNTER — Other Ambulatory Visit: Payer: Self-pay | Admitting: Surgery

## 2022-07-06 ENCOUNTER — Other Ambulatory Visit (INDEPENDENT_AMBULATORY_CARE_PROVIDER_SITE_OTHER): Payer: Self-pay | Admitting: Surgery

## 2022-07-06 ENCOUNTER — Encounter: Payer: Self-pay | Admitting: Surgery

## 2022-07-07 ENCOUNTER — Encounter: Payer: 59 | Admitting: Physician Assistant

## 2022-07-09 ENCOUNTER — Encounter: Payer: Self-pay | Admitting: Physician Assistant

## 2022-07-09 ENCOUNTER — Ambulatory Visit (INDEPENDENT_AMBULATORY_CARE_PROVIDER_SITE_OTHER): Payer: 59 | Admitting: Physician Assistant

## 2022-07-09 VITALS — BP 151/88 | HR 90 | Temp 98.4°F | Ht 72.0 in | Wt 201.0 lb

## 2022-07-09 DIAGNOSIS — Z09 Encounter for follow-up examination after completed treatment for conditions other than malignant neoplasm: Secondary | ICD-10-CM

## 2022-07-09 DIAGNOSIS — K8000 Calculus of gallbladder with acute cholecystitis without obstruction: Secondary | ICD-10-CM

## 2022-07-09 DIAGNOSIS — K819 Cholecystitis, unspecified: Secondary | ICD-10-CM

## 2022-07-09 MED ORDER — OXYCODONE HCL 5 MG PO TABS: 5.0000 mg | ORAL_TABLET | Freq: Every day | ORAL | 0 refills | Status: AC

## 2022-07-09 NOTE — Patient Instructions (Signed)

## 2022-07-09 NOTE — Progress Notes (Signed)
[facility] POST-OP OFFICE VISIT  07/09/2022  HPI: Justin Flowers is a 52 y.o. male 15 days s/p robotic assisted laparoscopic cholecystectomy for cholecystitis with Dr Christian Mate    He continues to have difficulty with abdominal pain He described this as a soreness; believes he went back to work too soon Mostly right sided and at umbilical incisions; worse at night time, interferes with sleep He is out of oxycodone; trying ibuprofen an tylenol No fever, chills, emesis Tolerating PO; no diarrhea Incisions are well healed No other complaints   Vital signs: There were no vitals taken for this visit.   Physical Exam: Constitutional: Well appearing male, NAD Abdomen: Soft, mild incisional soreness mainly at camera port site, slight RUQ discomfort as well, non-distended, no rebound/guarding Skin: Laparoscopic incisions are healing well, no erythema or drainage   Assessment/Plan:15 days s/p robotic assisted laparoscopic cholecystectomy for cholecystitis with Dr Christian Mate     - Pain control - I did offer alternatives to oxycodone including Flexeril & Tramadol given the patients continued pain; however, patient endorses significant side effects (ie: drowsiness, headaches) to these medications. He is trying alternative pain medications during the day but feels he needs oxycodone to aid in sleeping. I did discuss this with him at length, and I will give him a final prescription for 5mg  Oxycodone QHS x15 pills. He understands we will likely not refill any more narcotics baring significant change, and he needs to continue utilizing alternative modalities (ie: tylenol, motrin, ice, heat).   - Reviewed wound care recommendation  - Reviewed lifting restrictions; 4 weeks total  - Reviewed surgical pathology; Cholecystitis, areas of gangrenous necrosis, negative for malignancy  - He can follow up on as needed basis; He understands to call with questions/concerns  -- Edison Simon, PA-C [facility] 07/09/2022, 2:15 PM M-F: 7am - 4pm

## 2022-07-10 ENCOUNTER — Encounter: Payer: Self-pay | Admitting: Physician Assistant

## 2022-07-27 ENCOUNTER — Encounter: Payer: Self-pay | Admitting: Surgery

## 2023-04-19 ENCOUNTER — Emergency Department: Payer: 59

## 2023-04-19 ENCOUNTER — Emergency Department
Admission: EM | Admit: 2023-04-19 | Discharge: 2023-04-19 | Discharge status: Against Medical Advice | Payer: 59 | Attending: Emergency Medicine | Admitting: Emergency Medicine

## 2023-04-19 ENCOUNTER — Other Ambulatory Visit: Payer: Self-pay

## 2023-04-19 DIAGNOSIS — R002 Palpitations: Secondary | ICD-10-CM | POA: Diagnosis present

## 2023-04-19 DIAGNOSIS — R42 Dizziness and giddiness: Secondary | ICD-10-CM | POA: Insufficient documentation

## 2023-04-19 DIAGNOSIS — I10 Essential (primary) hypertension: Secondary | ICD-10-CM | POA: Diagnosis not present

## 2023-04-19 DIAGNOSIS — R519 Headache, unspecified: Secondary | ICD-10-CM | POA: Insufficient documentation

## 2023-04-19 DIAGNOSIS — R0602 Shortness of breath: Secondary | ICD-10-CM | POA: Insufficient documentation

## 2023-04-19 DIAGNOSIS — Z5321 Procedure and treatment not carried out due to patient leaving prior to being seen by health care provider: Secondary | ICD-10-CM | POA: Diagnosis not present

## 2023-04-19 DIAGNOSIS — R110 Nausea: Secondary | ICD-10-CM | POA: Diagnosis not present

## 2023-04-19 HISTORY — DX: Tachycardia, unspecified: R00.0

## 2023-04-19 LAB — CBC
HCT: 50.6 % (ref 39.0–52.0)
Hemoglobin: 16.8 g/dL (ref 13.0–17.0)
MCH: 28.2 pg (ref 26.0–34.0)
MCHC: 33.2 g/dL (ref 30.0–36.0)
MCV: 85 fL (ref 80.0–100.0)
Platelets: 247 10*3/uL (ref 150–400)
RBC: 5.95 MIL/uL — ABNORMAL HIGH (ref 4.22–5.81)
RDW: 14 % (ref 11.5–15.5)
WBC: 11.1 10*3/uL — ABNORMAL HIGH (ref 4.0–10.5)
nRBC: 0 % (ref 0.0–0.2)

## 2023-04-19 LAB — BASIC METABOLIC PANEL
Anion gap: 11 (ref 5–15)
BUN: 9 mg/dL (ref 6–20)
CO2: 25 mmol/L (ref 22–32)
Calcium: 9 mg/dL (ref 8.9–10.3)
Chloride: 100 mmol/L (ref 98–111)
Creatinine, Ser: 1.2 mg/dL (ref 0.61–1.24)
GFR, Estimated: >60 mL/min (ref >60)
Glucose, Bld: 145 mg/dL — ABNORMAL HIGH (ref 70–99)
Potassium: 3.8 mmol/L (ref 3.5–5.1)
Sodium: 136 mmol/L (ref 135–145)

## 2023-04-19 LAB — TROPONIN I (HIGH SENSITIVITY): Troponin I (High Sensitivity): 10 ng/L (ref <18)

## 2023-04-19 NOTE — ED Triage Notes (Signed)
 Pt here with wife for 3 days of palpitations, woke up tonight around midnight for palpitations, SOB, and lightheadedness. Pt st symptoms seem to come one when is laying down, once up he feels fine. Reports some nausea earlier but none now, has not vomited. Intermittent headache. Pt reports hx of tachycardia, pt takes lisinopril for HTN. A&O x4. Denies CP.   130/80 HR 110 At home when he checked his vitals

## 2023-08-16 ENCOUNTER — Encounter (HOSPITAL_COMMUNITY): Payer: Self-pay | Admitting: Emergency Medicine

## 2023-08-16 ENCOUNTER — Emergency Department (HOSPITAL_COMMUNITY)
Admission: EM | Admit: 2023-08-16 | Discharge: 2023-08-16 | Discharge status: Against Medical Advice | Attending: Emergency Medicine | Admitting: Emergency Medicine

## 2023-08-16 ENCOUNTER — Emergency Department (HOSPITAL_COMMUNITY)

## 2023-08-16 DIAGNOSIS — R200 Anesthesia of skin: Secondary | ICD-10-CM | POA: Diagnosis not present

## 2023-08-16 DIAGNOSIS — R0789 Other chest pain: Secondary | ICD-10-CM | POA: Diagnosis present

## 2023-08-16 DIAGNOSIS — R531 Weakness: Secondary | ICD-10-CM | POA: Insufficient documentation

## 2023-08-16 DIAGNOSIS — Z5321 Procedure and treatment not carried out due to patient leaving prior to being seen by health care provider: Secondary | ICD-10-CM | POA: Diagnosis not present

## 2023-08-16 LAB — CBC
HCT: 55.1 % — ABNORMAL HIGH (ref 39.0–52.0)
Hemoglobin: 18.5 g/dL — ABNORMAL HIGH (ref 13.0–17.0)
MCH: 28.4 pg (ref 26.0–34.0)
MCHC: 33.6 g/dL (ref 30.0–36.0)
MCV: 84.5 fL (ref 80.0–100.0)
Platelets: 297 10*3/uL (ref 150–400)
RBC: 6.52 MIL/uL — ABNORMAL HIGH (ref 4.22–5.81)
RDW: 14.4 % (ref 11.5–15.5)
WBC: 13.5 10*3/uL — ABNORMAL HIGH (ref 4.0–10.5)
nRBC: 0 % (ref 0.0–0.2)

## 2023-08-16 LAB — BASIC METABOLIC PANEL WITH GFR
Anion gap: 13 (ref 5–15)
BUN: 14 mg/dL (ref 6–20)
CO2: 23 mmol/L (ref 22–32)
Calcium: 9.4 mg/dL (ref 8.9–10.3)
Chloride: 100 mmol/L (ref 98–111)
Creatinine, Ser: 1.35 mg/dL — ABNORMAL HIGH (ref 0.61–1.24)
GFR, Estimated: >60 mL/min (ref >60)
Glucose, Bld: 101 mg/dL — ABNORMAL HIGH (ref 70–99)
Potassium: 3.7 mmol/L (ref 3.5–5.1)
Sodium: 136 mmol/L (ref 135–145)

## 2023-08-16 LAB — CBG MONITORING, ED: Glucose-Capillary: 100 mg/dL — ABNORMAL HIGH (ref 70–99)

## 2023-08-16 LAB — TROPONIN I (HIGH SENSITIVITY): Troponin I (High Sensitivity): 9 ng/L (ref <18)

## 2023-08-16 NOTE — ED Provider Triage Note (Signed)
 Emergency Medicine Provider Triage Evaluation Note  Justin Flowers , a 53 y.o. male  was evaluated in triage.  Pt complains of chest pain that is been present for the past 2 weeks however he started having left-sided facial numbness 2 hours ago.  Patient states has had this in the past and that is ultimately resolved.  Patient denies any cardiac history or respiratory history but states he is suffering from PTSD and anxiety and thinks this could be a panic attack.  Patient denies any weakness or dropping objects with me.  Patient denies any shortness of breath.  Patient states he is under a lot of stress.  Review of Systems  Positive:  Negative:   Physical Exam  BP (!) 156/89 (BP Location: Right Arm)   Pulse 97   Temp 98.1 F (36.7 C)   Resp 18   SpO2 96%  Gen:   Awake, no distress   Resp:  Normal effort  MSK:   Moves extremities without difficulty  Other:  Pupils PERRL bilaterally, vision grossly intact, 5 out of 5 bilateral grip strength, elbow flexion/extension, knee extension/flexion; able to ambulate without difficulty, negative pronator drift, reassuring finger-to-nose, equal smile, no ptosis, no slurring of words  Medical Decision Making  Medically screening exam initiated at 4:50 PM.  Appropriate orders placed.  Afton Albright Cornfield was informed that the remainder of the evaluation will be completed by another provider, this initial triage assessment does not replace that evaluation, and the importance of remaining in the ED until their evaluation is complete.  Workup initiated, stroke screen was negative and patient states he has had this in the past but will order CT scan, code stroke not initiated, patient stable at this time.   Denese Finn, PA-C 08/16/23 1652

## 2023-08-16 NOTE — ED Notes (Signed)
 Pt. Stated, "it's taking too long. I will check my results online. Pt. Moved OTF

## 2023-08-16 NOTE — ED Triage Notes (Signed)
 Pt here from home with c/o left side chest pain radiates to his left shoulder also c/o some left side facial numbness and some slight left are grip weakness

## 2023-08-16 NOTE — ED Notes (Signed)
 EKG completed. In triage. Green MD verified
